# Patient Record
Sex: Female | Born: 1950
Health system: Southern US, Community
[De-identification: ages and names within clinical notes are randomized; demographics above are authoritative.]

## PROBLEM LIST (undated history)

## (undated) DIAGNOSIS — G4700 Insomnia, unspecified: Secondary | ICD-10-CM

## (undated) DIAGNOSIS — F32A Depression, unspecified: Secondary | ICD-10-CM

## (undated) DIAGNOSIS — F329 Major depressive disorder, single episode, unspecified: Secondary | ICD-10-CM

## (undated) DIAGNOSIS — M797 Fibromyalgia: Secondary | ICD-10-CM

## (undated) DIAGNOSIS — M519 Unspecified thoracic, thoracolumbar and lumbosacral intervertebral disc disorder: Secondary | ICD-10-CM

## (undated) DIAGNOSIS — E049 Nontoxic goiter, unspecified: Secondary | ICD-10-CM

## (undated) DIAGNOSIS — K219 Gastro-esophageal reflux disease without esophagitis: Secondary | ICD-10-CM

## (undated) DIAGNOSIS — E7800 Pure hypercholesterolemia, unspecified: Secondary | ICD-10-CM

## (undated) DIAGNOSIS — I10 Essential (primary) hypertension: Secondary | ICD-10-CM

## (undated) DIAGNOSIS — Z9889 Other specified postprocedural states: Secondary | ICD-10-CM

## (undated) DIAGNOSIS — M1990 Unspecified osteoarthritis, unspecified site: Secondary | ICD-10-CM

## (undated) DIAGNOSIS — R112 Nausea with vomiting, unspecified: Secondary | ICD-10-CM

## (undated) HISTORY — DX: Fibromyalgia: M79.7

## (undated) HISTORY — PX: BUNIONECTOMY: SHX129

## (undated) HISTORY — PX: TONSILLECTOMY: SUR1361

## (undated) HISTORY — DX: Unspecified thoracic, thoracolumbar and lumbosacral intervertebral disc disorder: M51.9

## (undated) HISTORY — PX: FACIAL COSMETIC SURGERY: SHX629

## (undated) HISTORY — DX: Pure hypercholesterolemia, unspecified: E78.00

## (undated) HISTORY — DX: Major depressive disorder, single episode, unspecified: F32.9

## (undated) HISTORY — DX: Insomnia, unspecified: G47.00

## (undated) HISTORY — DX: Depression, unspecified: F32.A

---

## 2000-11-09 ENCOUNTER — Ambulatory Visit (HOSPITAL_COMMUNITY): Admission: RE | Admit: 2000-11-09 | Discharge: 2000-11-09 | Payer: Self-pay | Admitting: Pulmonary Disease

## 2002-05-16 ENCOUNTER — Encounter: Payer: Self-pay | Admitting: Unknown Physician Specialty

## 2002-05-16 ENCOUNTER — Ambulatory Visit (HOSPITAL_COMMUNITY): Admission: RE | Admit: 2002-05-16 | Discharge: 2002-05-16 | Payer: Self-pay | Admitting: Unknown Physician Specialty

## 2005-09-18 ENCOUNTER — Ambulatory Visit (HOSPITAL_COMMUNITY): Admission: RE | Admit: 2005-09-18 | Discharge: 2005-09-18 | Payer: Self-pay | Admitting: Pulmonary Disease

## 2005-10-18 ENCOUNTER — Emergency Department (HOSPITAL_COMMUNITY): Admission: EM | Admit: 2005-10-18 | Discharge: 2005-10-18 | Payer: Self-pay | Admitting: Emergency Medicine

## 2005-10-26 ENCOUNTER — Ambulatory Visit (HOSPITAL_COMMUNITY): Admission: RE | Admit: 2005-10-26 | Discharge: 2005-10-26 | Payer: Self-pay | Admitting: Pulmonary Disease

## 2005-11-13 ENCOUNTER — Ambulatory Visit: Payer: Self-pay | Admitting: Internal Medicine

## 2005-11-13 ENCOUNTER — Ambulatory Visit (HOSPITAL_COMMUNITY): Admission: RE | Admit: 2005-11-13 | Discharge: 2005-11-13 | Payer: Self-pay | Admitting: Gastroenterology

## 2007-09-13 ENCOUNTER — Ambulatory Visit (HOSPITAL_COMMUNITY): Admission: RE | Admit: 2007-09-13 | Discharge: 2007-09-13 | Payer: Self-pay | Admitting: Pulmonary Disease

## 2007-09-17 ENCOUNTER — Emergency Department (HOSPITAL_COMMUNITY): Admission: EM | Admit: 2007-09-17 | Discharge: 2007-09-17 | Payer: Self-pay | Admitting: Emergency Medicine

## 2008-03-19 ENCOUNTER — Ambulatory Visit (HOSPITAL_COMMUNITY): Admission: RE | Admit: 2008-03-19 | Discharge: 2008-03-19 | Payer: Self-pay | Admitting: Pulmonary Disease

## 2008-10-29 ENCOUNTER — Ambulatory Visit (HOSPITAL_COMMUNITY): Admission: RE | Admit: 2008-10-29 | Discharge: 2008-10-29 | Payer: Self-pay | Admitting: Endocrinology

## 2009-01-21 ENCOUNTER — Emergency Department (HOSPITAL_COMMUNITY): Admission: EM | Admit: 2009-01-21 | Discharge: 2009-01-22 | Payer: Self-pay | Admitting: Emergency Medicine

## 2009-02-14 ENCOUNTER — Encounter: Payer: Self-pay | Admitting: Orthopedic Surgery

## 2009-05-31 ENCOUNTER — Encounter (INDEPENDENT_AMBULATORY_CARE_PROVIDER_SITE_OTHER): Payer: Self-pay | Admitting: *Deleted

## 2009-05-31 ENCOUNTER — Ambulatory Visit (HOSPITAL_COMMUNITY): Admission: RE | Admit: 2009-05-31 | Discharge: 2009-05-31 | Payer: Self-pay | Admitting: Cardiovascular Disease

## 2009-05-31 ENCOUNTER — Ambulatory Visit: Payer: Self-pay | Admitting: Adult Health

## 2009-05-31 DIAGNOSIS — R5381 Other malaise: Secondary | ICD-10-CM

## 2009-05-31 DIAGNOSIS — R079 Chest pain, unspecified: Secondary | ICD-10-CM | POA: Insufficient documentation

## 2009-05-31 DIAGNOSIS — IMO0001 Reserved for inherently not codable concepts without codable children: Secondary | ICD-10-CM | POA: Insufficient documentation

## 2009-05-31 DIAGNOSIS — R5383 Other fatigue: Secondary | ICD-10-CM

## 2009-05-31 DIAGNOSIS — R0989 Other specified symptoms and signs involving the circulatory and respiratory systems: Secondary | ICD-10-CM | POA: Insufficient documentation

## 2009-05-31 DIAGNOSIS — M26629 Arthralgia of temporomandibular joint, unspecified side: Secondary | ICD-10-CM

## 2009-05-31 DIAGNOSIS — R0602 Shortness of breath: Secondary | ICD-10-CM

## 2009-05-31 DIAGNOSIS — E785 Hyperlipidemia, unspecified: Secondary | ICD-10-CM

## 2009-06-07 ENCOUNTER — Encounter: Payer: Self-pay | Admitting: Adult Health

## 2009-06-10 ENCOUNTER — Telehealth: Payer: Self-pay | Admitting: Adult Health

## 2009-06-11 ENCOUNTER — Encounter: Payer: Self-pay | Admitting: Adult Health

## 2009-06-14 ENCOUNTER — Encounter (INDEPENDENT_AMBULATORY_CARE_PROVIDER_SITE_OTHER): Payer: Self-pay | Admitting: *Deleted

## 2009-06-14 ENCOUNTER — Encounter: Payer: Self-pay | Admitting: Adult Health

## 2009-06-18 ENCOUNTER — Encounter: Payer: Self-pay | Admitting: Orthopedic Surgery

## 2009-06-18 ENCOUNTER — Ambulatory Visit (HOSPITAL_COMMUNITY): Admission: RE | Admit: 2009-06-18 | Discharge: 2009-06-18 | Payer: Self-pay | Admitting: Pulmonary Disease

## 2009-06-19 ENCOUNTER — Encounter: Payer: Self-pay | Admitting: Adult Health

## 2009-06-19 LAB — CONVERTED CEMR LAB
BUN: 9 mg/dL (ref 6–23)
Basophils Absolute: 0.1 10*3/uL (ref 0.0–0.1)
Chloride: 105 meq/L (ref 96–112)
Creatinine, Ser: 0.82 mg/dL (ref 0.40–1.20)
Eosinophils Relative: 2 % (ref 0–5)
INR: 1.1 (ref <1.50)
Lymphocytes Relative: 32 % (ref 12–46)
Neutro Abs: 2.5 10*3/uL (ref 1.7–7.7)
Neutrophils Relative %: 53 % (ref 43–77)
Platelets: 169 10*3/uL (ref 150–400)
Prothrombin Time: 14.1 s (ref 11.6–15.2)
RDW: 12.7 % (ref 11.5–15.5)

## 2009-06-20 ENCOUNTER — Encounter (INDEPENDENT_AMBULATORY_CARE_PROVIDER_SITE_OTHER): Payer: Self-pay

## 2009-06-24 ENCOUNTER — Encounter (INDEPENDENT_AMBULATORY_CARE_PROVIDER_SITE_OTHER): Payer: Self-pay | Admitting: *Deleted

## 2009-06-25 ENCOUNTER — Ambulatory Visit: Payer: Self-pay | Admitting: Cardiology

## 2009-06-25 ENCOUNTER — Inpatient Hospital Stay (HOSPITAL_BASED_OUTPATIENT_CLINIC_OR_DEPARTMENT_OTHER): Admission: RE | Admit: 2009-06-25 | Discharge: 2009-06-25 | Payer: Self-pay | Admitting: Cardiovascular Disease

## 2009-06-27 ENCOUNTER — Ambulatory Visit: Payer: Self-pay | Admitting: Cardiology

## 2009-07-01 ENCOUNTER — Ambulatory Visit: Payer: Self-pay | Admitting: Orthopedic Surgery

## 2009-07-01 DIAGNOSIS — M25519 Pain in unspecified shoulder: Secondary | ICD-10-CM | POA: Insufficient documentation

## 2009-07-01 DIAGNOSIS — M7580 Other shoulder lesions, unspecified shoulder: Secondary | ICD-10-CM

## 2009-07-01 DIAGNOSIS — S43439A Superior glenoid labrum lesion of unspecified shoulder, initial encounter: Secondary | ICD-10-CM

## 2009-07-04 ENCOUNTER — Encounter: Payer: Self-pay | Admitting: Orthopedic Surgery

## 2009-07-09 ENCOUNTER — Telehealth: Payer: Self-pay | Admitting: Orthopedic Surgery

## 2009-07-11 ENCOUNTER — Ambulatory Visit (HOSPITAL_COMMUNITY): Admission: RE | Admit: 2009-07-11 | Discharge: 2009-07-11 | Payer: Self-pay | Admitting: Orthopedic Surgery

## 2009-07-18 ENCOUNTER — Ambulatory Visit: Payer: Self-pay | Admitting: Orthopedic Surgery

## 2009-07-18 DIAGNOSIS — M75120 Complete rotator cuff tear or rupture of unspecified shoulder, not specified as traumatic: Secondary | ICD-10-CM | POA: Insufficient documentation

## 2009-07-19 ENCOUNTER — Ambulatory Visit: Payer: Self-pay | Admitting: Cardiovascular Disease

## 2009-07-22 ENCOUNTER — Encounter: Payer: Self-pay | Admitting: Orthopedic Surgery

## 2009-07-23 ENCOUNTER — Telehealth: Payer: Self-pay | Admitting: Orthopedic Surgery

## 2009-07-26 ENCOUNTER — Encounter: Payer: Self-pay | Admitting: Cardiology

## 2009-07-31 ENCOUNTER — Encounter: Payer: Self-pay | Admitting: Orthopedic Surgery

## 2009-08-12 ENCOUNTER — Encounter (HOSPITAL_COMMUNITY): Admission: RE | Admit: 2009-08-12 | Discharge: 2009-09-11 | Payer: Self-pay | Admitting: Orthopedic Surgery

## 2010-05-03 ENCOUNTER — Encounter: Payer: Self-pay | Admitting: Pulmonary Disease

## 2010-05-04 ENCOUNTER — Encounter: Payer: Self-pay | Admitting: Pulmonary Disease

## 2010-05-11 LAB — CONVERTED CEMR LAB
BUN: 14 mg/dL
CO2: 27 meq/L
Calcium: 9.5 mg/dL
Creatinine, Ser: 0.74 mg/dL
Glucose, Bld: 101 mg/dL
HCT: 42.7 %
Hemoglobin: 13.6 g/dL
INR: 1.03
Prothrombin Time: 13.4 s
WBC: 5.6 10*3/uL

## 2010-05-13 NOTE — Consult Note (Signed)
Summary: Consult note from  Dr. Dion Saucier  Consult note from  Dr. Dion Saucier   Imported By: Jacklynn Ganong 08/06/2009 10:18:53  _____________________________________________________________________  External Attachment:    Type:   Image     Comment:   External Document

## 2010-05-13 NOTE — Letter (Signed)
Summary: Historic Patient File  Historic Patient File   Imported By: Elvera Maria 07/12/2009 09:56:53  _____________________________________________________________________  External Attachment:    Type:   Image     Comment:   history form

## 2010-05-13 NOTE — Assessment & Plan Note (Signed)
Summary: RT SHOULDER PAIN TO BRING XR FILM/REPORT/SEC HOR/HAWKINS/BSF   Vital Signs:  Patient profile:   60 year old female Height:      68 inches Weight:      161 pounds Pulse rate:   62 / minute Resp:     16 per minute  Visit Type:  Initial Consult Referring Provider:  Dr. Juanetta Gosling Primary Provider:  Nehemiah Settle  CC:  right shoulder pain.  History of Present Illness: 60 year old female with atypical RIGHT shoulder pain.  The patient has had pain in her RIGHT shoulder off and on for 3 years.  In the last year it has gotten worse.  She complains of sharp throbbing burning moderate pain (7/10) which is intermittent located over the RIGHT shoulder anterior joint line, increased with movement of the RIGHT shoulder and arm.  She has tried some Advil no relief she cannot find anything that makes it better.  She says she does a lot of lifting she uses the arm quite a bit her pain radiates down into her RIGHT elbow.  He denies any numbness.  She has had a recent workup for chest pain with a heart catheterization this year in March was normal but she also has to wear and is in the two-week period of a Holter monitor test.  Xrays [facility] for review from 02/14/09, MRI from [facility] 06/18/09.  Meds: Wellbutrin, Cymbalta, Ambien, Flexeril, Pravastatin, Ritalin.      Allergies (verified): No Known Drug Allergies  Family History: Family History of Alcoholism:  Family History of Coronary Artery Disease:  Family History of CVA or Stroke:  Family History of Diabetes:  Family History of Hypertension:  Family History of Diabetes Family History Coronary Heart Disease female < 66 Hx, family, kidney disease NEC  Social History: Tobacco Use - No.  married unemployed Alcohol Use - no Regular Exercise - no Drug Use - no 2 cups of coffee per day  Review of Systems Constitutional:  Complains of fatigue; denies weight loss, weight gain, fever, and chills. Cardiovascular:  Complains of  chest pain; denies palpitations, fainting, and murmurs. Respiratory:  Complains of short of breath; denies wheezing, couch, tightness, pain on inspiration, and snoring . Gastrointestinal:  Complains of heartburn; denies nausea, vomiting, diarrhea, constipation, and blood in your stools. Genitourinary:  Denies frequency, urgency, difficulty urinating, painful urination, flank pain, and bleeding in urine. Neurologic:  Complains of numbness, tingling, unsteady gait, and dizziness; denies tremors and seizure. Musculoskeletal:  Complains of joint pain, instability, stiffness, and muscle pain; denies swelling, redness, and heat. Endocrine:  Denies excessive thirst, exessive urination, and heat or cold intolerance. Psychiatric:  Complains of depression; denies nervousness, anxiety, and hallucinations. Skin:  Denies changes in the skin, poor healing, rash, itching, and redness. HEENT:  Denies blurred or double vision, eye pain, redness, and watering; headache, difficult swallowing. Immunology:  Denies seasonal allergies, sinus problems, and allergic to bee stings; lactose intolerance. Hemoatologic:  Complains of brusing; denies easy bleeding.  Physical Exam  Additional Exam:  GEN: well developed, well nourished, normal grooming and hygiene, no deformity and normal body habitus.   CDV: pulses are normal, no edema, no erythema. no tenderness  Lymph: normal lymph nodes   Skin: no rashes, skin lesions or open sores   NEURO: normal coordination, reflexes, sensation.   Psyche: awake, alert and oriented. Mood normal   Gait: Normal  RIGHT shoulder exam inspection she is tender over the anterior joint line has pain when she extends the shoulder joint.  She is nontender over the a.c. joint and posterior joint line she is nontender over that and the subacromial space.  There is no effusion.  There is no crepitation on range of motion  She has full passive range of motion with no contracture.  Her  shoulder is stable she has some pain with abduction external rotation but no joint subluxation  Muscle strength remains grade 5 in internal, external rotation as well as forward elevation.  Her impingement sign is mildly positive.  Her crank test appears to be positive.      Impression & Recommendations:  Problem # 1:  SUPERIOR GLENOID LABRUM TEAR (ICD-840.7) Assessment New  MRI was done March 8.  He was a modestly inadequate study because of motion artifact secondary to patient having pain with her arm in the position needed to do the MRI.  We did see that she has a prominent tear the superior labrum which goes into the biceps anchor, there is thickening of the inferior glenohumeral ligament suggestive of adhesive capsulitis, supraspinatus tendinopathy infraspinatus tendinopathy, lateral sloping but acromion.  After my review of the MRI to see that she probably has a labral tear again motion artifact is limiting the diagnostic accuracy  Recommend repeat study with some pain medication to get a better look at things.  Her plain film done on on February 14, 2009 was normal and the RIGHT shoulder report is included  Orders: New Patient Level III (66440)  Problem # 2:  IMPINGEMENT SYNDROME (ICD-726.2) Assessment: New  Orders: New Patient Level III (34742) Joint Aspirate / Injection, Large (20610) Depo- Medrol 40mg  (J1030)  Problem # 3:  SHOULDER PAIN (ICD-719.41) Assessment: New  Her updated medication list for this problem includes:    Flexeril 10 Mg Tabs (Cyclobenzaprine hcl) .Marland Kitchen... Take as needed    Aspir-trin 325 Mg Tbec (Aspirin) .Marland Kitchen... Take 1 tab daily  Orders: New Patient Level III (59563) Joint Aspirate / Injection, Large (20610) Depo- Medrol 40mg  (J1030)  Patient Instructions: 1)  MRI shoulder come back for results 2)  You have received an injection of cortisone today. You may experience increased pain at the injection site. Apply ice pack to the area for 20 minutes  every 2 hours and take 2 xtra strength tylenol every 8 hours. This increased pain will usually resolve in 24 hours. The injection will take effect in 3-10 days.

## 2010-05-13 NOTE — Miscellaneous (Signed)
Summary: Faxed note to [facility]  I faxed all needed notes to [facility] @ [phone] for pt's cath on 06-25-09. Clinical Lists Changes

## 2010-05-13 NOTE — Letter (Signed)
Summary: *Referral Letter  [facility]  [address] [place]  [place], [place] [address]   Phone: [phone]  Fax: [phone]    07/22/2009  Thank you in advance for agreeing to see my patient:  Teresa [name] [address] [place], [place]  [address]  Phone: [phone]  Reason for Referral: evaluation for arthroscopic labral repair and treatment of partial tear rotator cuff    Current Medical Problems: 1)  RUPTURE ROTATOR CUFF (ICD-727.61) 2)  SUPERIOR GLENOID LABRUM TEAR (ICD-840.7) 3)  IMPINGEMENT SYNDROME (ICD-726.2) 4)  SHOULDER PAIN (ICD-719.41) 5)  HX, FAMILY, KIDNEY DISEASE NEC (ICD-V18.69) 6)  FAMILY HISTORY CORONARY HEART DISEASE FEMALE < 65 (ICD-V17.3) 7)  FAMILY HISTORY OF DIABETES (ICD-V18.0) 8)  TMJ PAIN (ICD-524.62) 9)  HYPERLIPIDEMIA (ICD-272.4) 10)  FIBROMYALGIA (ICD-729.1)   Current Medications: 1)  WELLBUTRIN XL 150 MG XR24H-TAB (BUPROPION HCL) take 1 tab two times a day 2)  BENADRYL 25 MG CAPS (DIPHENHYDRAMINE HCL) take 1 tab daily 3)  CYMBALTA 60 MG CPEP (DULOXETINE HCL) take 1 tab daily 4)  PRAVACHOL 40 MG TABS (PRAVASTATIN SODIUM) take 1 tab daily 5)  RITALIN 10 MG TABS (METHYLPHENIDATE HCL) take 1 tab daily 6)  FLEXERIL 10 MG TABS (CYCLOBENZAPRINE HCL) take as needed 7)  AMBIEN 10 MG TABS (ZOLPIDEM TARTRATE) take 1 tab at bedtime 8)  ESTRADIOL 1 MG TABS (ESTRADIOL) take 1/2 tab daily 9)  PROVERA 5 MG TABS (MEDROXYPROGESTERONE ACETATE) take 1/2 tab daily 10)  CENTRUM SILVER  TABS (MULTIPLE VITAMINS-MINERALS) take 1 tab daily 11)  CALTRATE 600+D PLUS 600-400 MG-UNIT TABS (CALCIUM CARBONATE-VIT D-MIN) take 1 tab two times a day 12)  ASPIR-TRIN 325 MG TBEC (ASPIRIN) take 1 tab daily 13)  KRILL OIL 1000 MG CAPS (KRILL OIL) take 1 tab daily 14)  DEOXY-D-RIBOSE  POWD (DEOXYRIBOSE) take 3 per day 15)  FIBER  POWD (FIBER) use as needed   Past Medical History: 1)  Current Problems:  2)  TMJ PAIN (ICD-524.62) 3)   HYPERLIPIDEMIA (ICD-272.4) 4)  FIBROMYALGIA (ICD-729.1)     Pertinent Labs: MRI   Thank you again for agreeing to see our patient; please contact us if you have any further questions or need additional information.  Sincerely,  Fuller Canada MD

## 2010-05-13 NOTE — Miscellaneous (Signed)
Summary: cbc,pt,ptt,bmp,tsh( pre cath labs)  Clinical Lists Changes  Observations: Added new observation of CALCIUM: 9.5 mg/dL (60/63/0160 10:93) Added new observation of CREATININE: 0.74 mg/dL (23/55/7322 02:54) Added new observation of BUN: 14 mg/dL (27/09/2374 28:31) Added new observation of BG RANDOM: 101 mg/dL (51/76/1607 37:10) Added new observation of CO2 PLSM/SER: 27 meq/L (05/31/2009 13:09) Added new observation of CL SERUM: 103 meq/L (05/31/2009 13:09) Added new observation of K SERUM: 4.5 meq/L (05/31/2009 13:09) Added new observation of NA: 142 meq/L (05/31/2009 13:09) Added new observation of PLATELETK/UL: 202 K/uL (05/31/2009 13:09) Added new observation of MCV: 92.2 fL (05/31/2009 13:09) Added new observation of HCT: 42.7 % (05/31/2009 13:09) Added new observation of HGB: 13.6 g/dL (62/69/4854 62:70) Added new observation of WBC COUNT: 5.6 10*3/microliter (05/31/2009 13:09) Added new observation of TSH: 0.580 microintl units/mL (05/31/2009 13:09) Added new observation of INR: 1.03  (05/31/2009 13:09) Added new observation of PT PATIENT: 13.4 s (05/31/2009 13:09) Added new observation of PTT PATIENT: 27 s (05/31/2009 13:09)

## 2010-05-13 NOTE — Assessment & Plan Note (Signed)
Summary: EPH   Visit Type:  Follow-up Referring Provider:  Dr. Juanetta Gosling Primary Provider:  Nehemiah Settle  CC:  NO CARDIOLOGY COMPLAINTS.  History of Present Illness: Teresa [name] is seen today post cath.  She was seen by Harrold Donath with [facility] and needed pre-op clearance for right shoulder surgery. Her cath was bengin with minimal ostial LAD and OM  She has normal LV function and filling pressures  I reassured Teresa [name] that her heart was ok and she is cleared for surgery.  The tape placed on her groin post-cath casused some redness and irritation but there was no hematoma  Current Problems (verified): 1)  Superior Glenoid Labrum Tear  (ICD-840.7) 2)  Impingement Syndrome  (ICD-726.2) 3)  Shoulder Pain  (ICD-719.41) 4)  Hx, Family, Kidney Disease Nec  (ICD-V18.69) 5)  Family History Coronary Heart Disease Female < 65  (ICD-V17.3) 6)  Family History of Diabetes  (ICD-V18.0) 7)  Unspecified Pre-operative Examination  (ICD-V72.84) 8)  Chest Pain-unspecified  (ICD-786.50) 9)  Tachycardia  (ICD-785) 10)  Tachycardia  (ICD-785) 11)  Dyspnea  (ICD-786.05) 12)  Dyspnea  (ICD-786.05) 13)  Fatigue  (ICD-780.79) 14)  Chest Pain  (ICD-786.50) 15)  Tmj Pain  (ICD-524.62) 16)  Hyperlipidemia  (ICD-272.4) 17)  Fibromyalgia  (ICD-729.1)  Current Medications (verified): 1)  Wellbutrin Xl 150 Mg Xr24h-Tab (Bupropion Hcl) .... Take 1 Tab Two Times A Day 2)  Benadryl 25 Mg Caps (Diphenhydramine Hcl) .... Take 1 Tab Daily 3)  Cymbalta 60 Mg Cpep (Duloxetine Hcl) .... Take 1 Tab Daily 4)  Pravachol 40 Mg Tabs (Pravastatin Sodium) .... Take 1 Tab Daily 5)  Ritalin 10 Mg Tabs (Methylphenidate Hcl) .... Take 1 Tab Daily 6)  Flexeril 10 Mg Tabs (Cyclobenzaprine Hcl) .... Take As Needed 7)  Ambien 10 Mg Tabs (Zolpidem Tartrate) .... Take 1 Tab At Bedtime 8)  Estradiol 1 Mg Tabs (Estradiol) .... Take 1/2 Tab Daily 9)  Provera 5 Mg Tabs (Medroxyprogesterone Acetate) .... Take 1/2 Tab Daily 10)  Centrum Silver   Tabs (Multiple Vitamins-Minerals) .... Take 1 Tab Daily 11)  Caltrate 600+d Plus 600-400 Mg-Unit Tabs (Calcium Carbonate-Vit D-Min) .... Take 1 Tab Two Times A Day 12)  Aspir-Trin 325 Mg Tbec (Aspirin) .... Take 1 Tab Daily 13)  Krill Oil 1000 Mg Caps (Krill Oil) .... Take 1 Tab Daily 14)  Deoxy-D-Ribose  Powd (Deoxyribose) .... Take 3 Per Day 15)  Fiber  Powd (Fiber) .... Use As Needed  Allergies (verified): No Known Drug Allergies  Past History:  Past Medical History: Last updated: 05/31/2009 Current Problems:  TMJ PAIN (ICD-524.62) HYPERLIPIDEMIA (ICD-272.4) FIBROMYALGIA (ICD-729.1)  Past Surgical History: Last updated: 05/31/2009 Tonsilectomy Face lift Bunionectomy  Family History: Last updated: 07/01/2009 Family History of Alcoholism:  Family History of Coronary Artery Disease:  Family History of CVA or Stroke:  Family History of Diabetes:  Family History of Hypertension:  Family History of Diabetes Family History Coronary Heart Disease female < 60 Hx, family, kidney disease NEC  Social History: Last updated: 07/01/2009 Tobacco Use - No.  married unemployed Alcohol Use - no Regular Exercise - no Drug Use - no 2 cups of coffee per day  Review of Systems       Denies fever, malais, weight loss, blurry vision, decreased visual acuity, cough, sputum, SOB, hemoptysis, pleuritic pain, palpitaitons, heartburn, abdominal pain, melena, lower extremity edema, claudication, or rash.   Vital Signs:  Patient profile:   60 year old female Weight:      160 pounds  Pulse rate:   90 / minute BP sitting:   158 / 80  (right arm)  Vitals Entered By: Dreama Saa, CNA (July 19, 2009 11:44 AM)  Physical Exam  General:  Affect appropriate Healthy:  appears stated age HEENT: normal Neck supple with no adenopathy JVP normal no bruits no thyromegaly Lungs clear with no wheezing and good diaphragmatic motion Heart:  S1/S2 no murmur,rub, gallop or click PMI  normal Abdomen: benighn, BS positve, no tenderness, no AAA no bruit.  No HSM or HJR Distal pulses intact with no bruits No edema Neuro non-focal Skin warm and dry Groin well healed with no bruit or hematoma   Impression & Recommendations:  Problem # 1:  CHEST PAIN-UNSPECIFIED (ICD-786.50) Non-cardiac  No significant disease at cath. Clear for ortho surgery Her updated medication list for this problem includes:    Aspir-trin 325 Mg Tbec (Aspirin) .Marland Kitchen... Take 1 tab daily  Patient Instructions: 1)  Your physician recommends that you schedule a follow-up appointment in: as needed  2)  Your physician recommends that you continue on your current medications as directed. Please refer to the Current Medication list given to you today.

## 2010-05-13 NOTE — Medication Information (Signed)
Summary: Copy of prescription  Copy of prescription   Imported By: Jacklynn Ganong 07/03/2009 12:18:16  _____________________________________________________________________  External Attachment:    Type:   Image     Comment:   External Document

## 2010-05-13 NOTE — Progress Notes (Signed)
Summary: MRI appointment.  Phone Note Outgoing Call   Call placed by: Waldon Reining,  July 09, 2009 10:14 AM Call placed to: Patient Action Taken: Phone Call Completed, Appt scheduled Summary of Call: I called to give the patient her MRI appointment at [facility] on 07-11-09 at  4:30. Patient has [employer], authorization [id number]. Patient will follow up back here for her results.

## 2010-05-13 NOTE — Miscellaneous (Signed)
Summary: Orders Update  Clinical Lists Changes  Orders: Added new Referral order of Cardiac Catheterization (Cardiac Cath) - Signed 

## 2010-05-13 NOTE — Letter (Signed)
Summary: Cardiac Catheterization Instructions- [facility]  [facility] [place]  [address], [place] [address]   Phone: [phone]  Fax: [phone]     05/31/2009 MRN: [id number]  Teresa [name] [address] [place], [place]  [address]  Dear Ms. Agyeman,   You are scheduled for a Cardiac Catheterization on 06/07/2009 with Dr.McClean.  Please arrive to the 1st floor of the Heart and Vascular Center at [facility] at 7:30 am  on the day of your procedure. Please do not arrive before 6:30 a.m. Call the Heart and Vascular Center at [phone] if you are unable to make your appointmnet. The Code to get into the parking garage under the building is 0900. Take the elevators to the 1st floor. You must have someone to drive you home. Someone must be with you for the first 24 hours after you arrive home. Please wear clothes that are easy to get on and off and wear slip-on shoes. Do not eat or drink after midnight except water with your medications that morning. Bring all your medications and current insurance cards with you.  ___ DO NOT take these medications before your procedure: ________________________________________________________________  ___ Make sure you take your aspirin.  ___ You may take ALL of your medications with water that morning. ________________________________________________________________________________________________________________________________  ___ DO NOT take ANY medications before your procedure.  ___ Pre-med instructions:  ________________________________________________________________________________________________________________________________  The usual length of stay after your procedure is 2 to 3 hours. This can vary.  If you have any questions, please call the office at the number listed above.   Teressa Lower RN

## 2010-05-13 NOTE — Progress Notes (Signed)
Summary: Question  Phone Note Call from Patient   Caller: Spouse Brett Canales) Reason for Call: Talk to Nurse Summary of Call: pt's husband would like to know if we were gonna schedule anymore "testing" since the cath was "operating outside of guidelines"/tg  Initial call taken by: Raechel Ache [facility],  June 10, 2009 3:20 PM  Follow-up for Phone Call        Bronson Curb, would u like to schedule a stress test  for this pt, since her ins. will not pay for cath, she is to wear a 21 day event monitor also. Follow-up by: Teressa Lower RN,  June 10, 2009 4:57 PM  Additional Follow-up for Phone Call Additional follow up Details #1::        We planned to do a stress myoview intially, but her husband wanted her to have a cath.  We can schedule her for the stress test.     Appended Document: Question pt's spouse call with authorization number from insurance company and wanted her cath scheduled for 06/25/2009, which we did per their request

## 2010-05-13 NOTE — Medication Information (Signed)
Summary: Tax adviser   Imported By: Cammie Sickle 08/03/2009 12:14:41  _____________________________________________________________________  External Attachment:    Type:   Image     Comment:   External Document

## 2010-05-13 NOTE — Procedures (Signed)
Summary: [facility]  [employer]   Imported By: Faythe Ghee 07/26/2009 15:34:16  _____________________________________________________________________  External Attachment:    Type:   Image     Comment:   External Document

## 2010-05-13 NOTE — Miscellaneous (Signed)
Summary: Orders Update  Clinical Lists Changes  Orders: Added new Referral order of Nuclear Stress Test (Nuc Stress Test) - Signed 

## 2010-05-13 NOTE — Letter (Signed)
Summary: AARP MEDICARE PLAN  AARP MEDICARE PLAN   Imported By: Faythe Ghee 06/07/2009 10:42:40  _____________________________________________________________________  External Attachment:    Type:   Image     Comment:   External Document

## 2010-05-13 NOTE — Letter (Signed)
Summary: Cardiac Catheterization Instructions- [facility]  [facility] [place]  [address] [place] [address]   Phone: [phone]  Fax: [phone]     06/14/2009 MRN: [id number]  KENSLIE ABBRUZZESE [address] [place], [place]  [address]  Dear Ms. Aufiero,   You are scheduled for a Cardiac Catheterization on 06/25/2009 with Dr.Stuckey  Please arrive to the 1st floor of the Heart and Vascular Center at [facility] at 9:30 am  on the day of your procedure. Please do not arrive before 6:30 a.m. Call the Heart and Vascular Center at [phone] if you are unable to make your appointmnet. The Code to get into the parking garage under the building is 9000. Take the elevators to the 1st floor. You must have someone to drive you home. Someone must be with you for the first 24 hours after you arrive home. Please wear clothes that are easy to get on and off and wear slip-on shoes. Do not eat or drink after midnight except water with your medications that morning. Bring all your medications and current insurance cards with you.  ___ DO NOT take these medications before your procedure: ________________________________________________________________  ___ Make sure you take your aspirin.  ___ You may take ALL of your medications with water that morning. ________________________________________________________________________________________________________________________________  ___ DO NOT take ANY medications before your procedure.  ___ Pre-med instructions:  ________________________________________________________________________________________________________________________________  The usual length of stay after your procedure is 2 to 3 hours. This can vary.  If you have any questions, please call the office at the number listed above.   Teressa Lower RN

## 2010-05-13 NOTE — Miscellaneous (Signed)
Summary: waiting on case review for MRI case number [id number]  Clinical Lists Changes  an take 2 business days - See phone note.  MRI auth received, Auth # [id number] and appointment scheduled at [facility] 07/11/09 at 4:30pm; patient has follow up appointment here to review results.

## 2010-05-13 NOTE — Progress Notes (Signed)
Summary: Referral to Dr. Dion Saucier.  Phone Note Outgoing Call   Call placed by: Waldon Reining,  July 23, 2009 10:21 AM Call placed to: Patient Action Taken: Information Sent Summary of Call: I faxed a referral for this patient to Dr. Dion Saucier for her right shoulder, labrum tear.

## 2010-05-13 NOTE — Assessment & Plan Note (Signed)
Summary: mri results from [facility]   Visit Type:  Follow-up Referring Provider:  Dr. Juanetta Gosling Primary Provider:  Nehemiah [name]  CC:  right shoulder pain.  History of Present Illness: I saw Teresa [name] in the office today for a followup visit.  She is a 60 years old woman with the complaint of:  right shoulder pain.  Meds: Wellbutrin, Cymbalta, Ambien, Flexeril, Pravastatin, Ritalin.  a second MRI was done and it shows that she does have a superior labral tear.  She also has a partial undersurface tear of the supraspinatus tendon.  There are some capsular changes consistent with adhesive capsulitis  I spent an hour talking to them about the treatment options I think she should have an arthroscopy of the shoulder to repair the labrum and I have advised that she see a shoulder specialist.  They would like to discuss this further over the weekend and let me know on Monday whether they want to go to [facility] or 2 the [facility].    Allergies: No Known Drug Allergies  Past History:  Past medical, surgical, family and social histories (including risk factors) reviewed, and no changes noted (except as noted below).  Past Medical History: Reviewed history from 05/31/2009 and no changes required. Current Problems:  TMJ PAIN (ICD-524.62) HYPERLIPIDEMIA (ICD-272.4) FIBROMYALGIA (ICD-729.1)  Past Surgical History: Reviewed history from 05/31/2009 and no changes required. Tonsilectomy Face lift Bunionectomy  Family History: Reviewed history from 07/01/2009 and no changes required. Family History of Alcoholism:  Family History of Coronary Artery Disease:  Family History of CVA or Stroke:  Family History of Diabetes:  Family History of Hypertension:  Family History of Diabetes Family History Coronary Heart Disease female < 7 Hx, family, kidney disease NEC  Social History: Reviewed history from 07/01/2009 and no changes required. Tobacco Use - No.    married unemployed Alcohol Use - no Regular Exercise - no Drug Use - no 2 cups of coffee per day   Impression & Recommendations:  Problem # 1:  SUPERIOR GLENOID LABRUM TEAR (ICD-840.7) Assessment Comment Only  Orders: Orthopedic Surgeon Referral (Ortho Surgeon) Est. Patient Level III (60454) Orthopedic Surgeon Referral (Ortho Surgeon)  Problem # 2:  RUPTURE ROTATOR CUFF (ICD-727.61) Assessment: Comment Only  Orders: Est. Patient Level III (09811) Orthopedic Surgeon Referral (Ortho Surgeon)  Patient Instructions: 1)  Call us Monday

## 2010-05-13 NOTE — Letter (Signed)
Summary: [place] [employer], agricultural at [facility]  [address], [place] [address]   Phone: [phone]  Fax: [phone]      June 24, 2009 MRN: [id number]   [name] [address] [place], [place]  [address]   Dear Ms. Schorr,  Your test ordered by Teresa [name] has been reviewed by your physician (or physician assistant) and was found to be normal or stable. Your physician (or physician assistant) felt no changes were needed at this time.  ____ Echocardiogram  ____ Cardiac Stress Test  __x__ Lab Work  ____ Peripheral vascular study of arms, legs or neck  ____ CT scan or X-ray  ____ Lung or Breathing test  ____ Other: No change in medical treatment at this time, per Teresa [name].  Thank you, Teresa Terwilliger Allyne Gee RN    Teresa Bing, MD, Teresa Arena.C.Gaylord Shih, MD, F.A.C.C Teresa Bunting, MD, F.A.C.C Teresa Dell, MD, F.A.C.C Teresa Haws, MD, Teresa Arena.C.C

## 2010-05-13 NOTE — Assessment & Plan Note (Signed)
Summary: NP6 CHEST PAIN   Visit Type:  Initial Consult Primary Provider:  Nehemiah Settle  CC:  chest pain.  History of Present Illness: Teresa [name] is a pleasant 60 y/o CF we are seeing on consultation at the request of Dr. Juanetta Gosling for chest pain and increasing DOE.  She has not been seen by a cardiologist in the past.  She has a history of Fibromyalgia, GERD, DJD of the neck, TMJ, thyroid nodules, hypercholesterolemia. She has had progressive fatigue, DOE, and midsternal chest pain. She states that she has noticed increased HR and clamminess associated.  Not all of these symptoms occur together, and chest discomfort is hard to distinguish from fibromyalgia pain.  However, her husband who accompanies her states she diminishes her discomfort, and he has noticed her DOE more.  She states that the pain is a midsternal ache, non radiating, that can occur with and without exertion.  She recently moved her bedroom furniture around and had racing HR, pre-syncope, and SOB soon after.  These symptoms were expressed to Dr. Juanetta Gosling who requested this evaluation.  Preventive Screening-Counseling & Management  Alcohol-Tobacco     Alcohol drinks/day: 0     Smoking Status: never  Problems Prior to Update: 1)  Tmj Pain  (ICD-524.62) 2)  Hyperlipidemia  (ICD-272.4) 3)  Fibromyalgia  (ICD-729.1)  Medications Prior to Update: 1)  Prednisone 20 Mg Tabs (Prednisone) .... Take As Directed 2)  Wellbutrin Xl 150 Mg Xr24h-Tab (Bupropion Hcl) .... Take 1 Tab Daily 3)  Benadryl 25 Mg Caps (Diphenhydramine Hcl) .... Take 1 Tab Daily 4)  Cymbalta 60 Mg Cpep (Duloxetine Hcl) .... Take 1 Tab Daily 5)  Pravachol 40 Mg Tabs (Pravastatin Sodium) .... Take 1 Tab Daily 6)  Ritalin 10 Mg Tabs (Methylphenidate Hcl) .... Take 1 Tab Daily 7)  Flexeril 10 Mg Tabs (Cyclobenzaprine Hcl) .... Take As Needed 8)  Ambien 10 Mg Tabs (Zolpidem Tartrate) .... Take 1 Tab At Bedtime  Current Medications (verified): 1)  Wellbutrin  Xl 150 Mg Xr24h-Tab (Bupropion Hcl) .... Take 1 Tab Two Times A Day 2)  Benadryl 25 Mg Caps (Diphenhydramine Hcl) .... Take 1 Tab Daily 3)  Cymbalta 60 Mg Cpep (Duloxetine Hcl) .... Take 1 Tab Daily 4)  Pravachol 40 Mg Tabs (Pravastatin Sodium) .... Take 1 Tab Daily 5)  Ritalin 10 Mg Tabs (Methylphenidate Hcl) .... Take 1 Tab Daily 6)  Flexeril 10 Mg Tabs (Cyclobenzaprine Hcl) .... Take As Needed 7)  Ambien 10 Mg Tabs (Zolpidem Tartrate) .... Take 1 Tab At Bedtime 8)  Estradiol 1 Mg Tabs (Estradiol) .... Take 1/2 Tab Daily 9)  Provera 5 Mg Tabs (Medroxyprogesterone Acetate) .... Take 1/2 Tab Daily 10)  Centrum Silver  Tabs (Multiple Vitamins-Minerals) .... Take 1 Tab Daily 11)  Caltrate 600+d Plus 600-400 Mg-Unit Tabs (Calcium Carbonate-Vit D-Min) .... Take 1 Tab Two Times A Day 12)  Aspir-Trin 325 Mg Tbec (Aspirin) .... Take 1 Tab Daily 13)  Krill Oil 1000 Mg Caps (Krill Oil) .... Take 1 Tab Daily 14)  Deoxy-D-Ribose  Powd (Deoxyribose) .... Take 3 Per Day 15)  Fiber  Powd (Fiber) .... Use As Needed  Allergies (verified): No Known Drug Allergies  Past History:  Past Surgical History: Tonsilectomy Face lift Bunionectomy  Family History: Family History of Alcoholism:  Family History of Coronary Artery Disease:  Family History of CVA or Stroke:  Family History of Diabetes:  Family History of Hypertension:   Social History: Alcohol drinks/day:  0  Review of Systems  Fatigue All other systems have been reviewed and are negative unless stated above.   Vital Signs:  Patient profile:   60 year old female Height:      67 inches Weight:      166 pounds BMI:     26.09 Pulse rate:   103 / minute BP sitting:   139 / 73  (right arm)  Vitals Entered By: Dreama Saa, CNA (May 31, 2009 10:34 AM)  Physical Exam  General:  Well developed, well nourished, in no acute distress. Head:  normocephalic and atraumatic Eyes:  PERRLA/EOM intact; conjunctiva and lids  normal. Ears:  TM's intact and clear with normal canals and hearing Nose:  no deformity, discharge, inflammation, or lesions Mouth:  Teeth, gums and palate normal. Oral mucosa normal. Neck:  enlarged thyroid.  enlarged thyroid.  enlarged thyroid.   Lungs:  Clear bilaterally to auscultation and percussion. Heart:  Non-displaced PMI, chest non-tender; regular rate and rhythm, S1, S2 without murmurs, rubs or gallops. Carotid upstroke normal, no bruit. Normal abdominal aortic size, no bruits. Femorals normal pulses, no bruits. Pedals normal pulses. No edema, no varicosities. Abdomen:  Bowel sounds positive; abdomen soft and non-tender without masses, organomegaly, or hernias noted. No hepatosplenomegaly. Msk:  C/o of R shoulder pain. decreased ROM.  decreased ROM.  decreased ROM.   Pulses:  pulses normal in all 4 extremities Extremities:  No clubbing or cyanosis. Neurologic:  weakness noted: R shoulderweakness noted:.   Skin:  Intact without lesions or rashes. Psych:  Normal affect.   EKG  Procedure date:  05/31/2009  Findings:      Normal sinus rhythm with rate of:  95bpm  Impression & Recommendations:  Problem # 1:  TACHYCARDIA (ICD-785) Place cardionet for assessment of SVT or tachyarrythmias.    Problem # 2:  CHEST PAIN (ICD-786.50) She has typical and atypical features concerning chest discomfort. She is uncertain if this is related to fibromyalgia or different.  Her husband insists on cardiac catherization to have 100% certainty concenring CAD with her family history.  Dr. Eden Emms has also spoken with the patient and her husband and we will proceed with cardiac catherization. This will be scheduled next week.  Risks and benefits have been discussed. They verbalize understanding and are willing to proceed. Her updated medication list for this problem includes:    Aspir-trin 325 Mg Tbec (Aspirin) .Marland Kitchen... Take 1 tab daily  Orders: Cardiac Catheterization (Cardiac Cath) Cardionet/Event  Monitor (Cardionet/Event) T-Chest x-ray, 2 views ([id number])Future Orders: T-Basic Metabolic Panel [id number]) ... 06/05/2009 T-CBC w/Diff ([id number]) ... 06/05/2009 T-Protime, Auto [id number]) ... 06/05/2009 T-PTT ([id number]) ... 06/05/2009 T-TSH [id number]) ... 06/05/2009  Patient Instructions: 1)  Your physician recommends that you schedule a follow-up appointment in: after cath and event monitor 2)  Your physician has recommended that you wear an event monitor.  Event monitors are medical devices that record the heart's electrical activity. Doctors most often use these monitors to diagnose arrhythmias. Arrhythmias are problems with the speed or rhythm of the heartbeat. The monitor is a small, portable device. You can wear one while you do your normal daily activities. This is usually used to diagnose what is causing palpitations/syncope (passing out). 3)  Your physician has requested that you have a cardiac catheterization.  Cardiac catheterization is used to diagnose and/or treat various heart conditions. Doctors may recommend this procedure for a number of different reasons. The most common reason is to evaluate chest pain. Chest pain can be a symptom of coronary artery disease (  CAD), and cardiac catheterization can show whether plaque is narrowing or blocking your heart's arteries. This procedure is also used to evaluate the valves, as well as measure the blood flow and oxygen levels in different parts of your heart.  For further information please visit [email or web address]  Please follow instruction sheet, as given.

## 2010-05-13 NOTE — Miscellaneous (Signed)
Summary: pre cath labs  Clinical Lists Changes  Problems: Added new problem of UNSPECIFIED PRE-OPERATIVE EXAMINATION (ICD-V72.84) - cardiac catherization Orders: Added new Test order of T-Basic Metabolic Panel [id number]) - Signed Added new Test order of T-CBC w/Diff [id number]) - Signed Added new Test order of T-PTT ([id number]) - Signed Added new Test order of T-Protime, Auto ([id number]) - Signed

## 2010-06-17 ENCOUNTER — Ambulatory Visit (INDEPENDENT_AMBULATORY_CARE_PROVIDER_SITE_OTHER): Payer: Self-pay | Admitting: Internal Medicine

## 2010-08-29 NOTE — Op Note (Signed)
NAMEJOHNATHON, [name]                ACCOUNT NO.:  [id number]   MEDICAL RECORD NO.:  [id number]          PATIENT TYPE:  AMB   LOCATION:  DAY                           FACILITY:  [facility]   PHYSICIAN:  Lionel December, M.D.    DATE OF BIRTH:  October 11, 1950   DATE OF PROCEDURE:  11/13/2005  DATE OF DISCHARGE:                                 OPERATIVE REPORT   PROCEDURE:  Colonoscopy.   INDICATIONS:  Teresa [name] is a 60 year old Caucasian female, who is here for  average-risk screening colonoscopy.  Family history is negative for  colorectal carcinoma and positive for polyps in her mother and two sisters.  Procedure and risks were reviewed with the patient and informed consent was  obtained.   MEDICATIONS FOR CONSCIOUS SEDATION:  Demerol 50 mg IV, Versed 10 mg IV.   FINDINGS:  Procedure performed in endoscopy suite.  The patient's vital  signs and O2 sat were monitored during the procedure and remained stable.  The patient was placed in left lateral recumbent position and rectal  examination performed.  No abnormality noted on external or digital exam.  Olympus video scope was placed in the rectum and advanced under vision into  the sigmoid colon beyond.  Preparation was excellent.  Scope was passed into  the cecum, which was identified by the ileocecal valve and appendiceal  orifice.  Pictures were taken for the record.  Colonic mucosa was examined  for the second time on the way out and was normal throughout.  Rectal mucosa  similarly was normal.  Scope was retroflexed to examine the anorectal  junction and small hemorrhoids were noted below the dentate line.  Endoscope  was straightened and withdrawn.  The patient tolerated the procedure well.   FINAL DIAGNOSES:  Small external hemorrhoids, otherwise normal colonoscopy.   RECOMMENDATIONS:  1.  She will resume her usual medications.  2.  High-fiber diet, which would help with her constipation.  3.  Yearly Hemoccults.  4.  She may consider next  exam in 10 years from now, unless her family      history has changed before then.      Lionel December, M.D.  Electronically Signed     NR/MEDQ  D:  11/13/2005  T:  11/13/2005  Job:  [id number]   cc:   Ramon Dredge L. Juanetta Gosling, M.D.  Fax: [phone]

## 2010-09-11 ENCOUNTER — Ambulatory Visit (INDEPENDENT_AMBULATORY_CARE_PROVIDER_SITE_OTHER): Payer: Self-pay

## 2010-09-11 DIAGNOSIS — I83893 Varicose veins of bilateral lower extremities with other complications: Secondary | ICD-10-CM

## 2010-10-23 ENCOUNTER — Ambulatory Visit: Payer: Self-pay

## 2011-01-08 LAB — CBC
Hemoglobin: 14
Platelets: 212
RDW: 12.8

## 2011-01-08 LAB — B-NATRIURETIC PEPTIDE (CONVERTED LAB): Pro B Natriuretic peptide (BNP): <30

## 2011-01-08 LAB — BASIC METABOLIC PANEL
Calcium: 9.5
GFR calc non Af Amer: >60
Glucose, Bld: 101 — ABNORMAL HIGH
Sodium: 139

## 2011-01-08 LAB — POCT CARDIAC MARKERS
Myoglobin, poc: 155
Operator id: 217431

## 2011-01-08 LAB — DIFFERENTIAL
Basophils Absolute: 0
Lymphocytes Relative: 25
Neutro Abs: 5.6

## 2011-01-08 LAB — D-DIMER, QUANTITATIVE: D-Dimer, Quant: 1.1 — ABNORMAL HIGH

## 2011-02-06 ENCOUNTER — Encounter (INDEPENDENT_AMBULATORY_CARE_PROVIDER_SITE_OTHER): Payer: Self-pay | Admitting: *Deleted

## 2011-02-17 ENCOUNTER — Other Ambulatory Visit (INDEPENDENT_AMBULATORY_CARE_PROVIDER_SITE_OTHER): Payer: Self-pay | Admitting: *Deleted

## 2011-02-17 ENCOUNTER — Ambulatory Visit (INDEPENDENT_AMBULATORY_CARE_PROVIDER_SITE_OTHER): Payer: Medicare Other | Admitting: Internal Medicine

## 2011-02-17 ENCOUNTER — Encounter (INDEPENDENT_AMBULATORY_CARE_PROVIDER_SITE_OTHER): Payer: Self-pay | Admitting: *Deleted

## 2011-02-17 ENCOUNTER — Encounter (INDEPENDENT_AMBULATORY_CARE_PROVIDER_SITE_OTHER): Payer: Self-pay | Admitting: Internal Medicine

## 2011-02-17 VITALS — BP 138/70 | HR 80 | Temp 98.6°F | Ht 67.0 in | Wt 158.2 lb

## 2011-02-17 DIAGNOSIS — K219 Gastro-esophageal reflux disease without esophagitis: Secondary | ICD-10-CM

## 2011-02-17 DIAGNOSIS — K5900 Constipation, unspecified: Secondary | ICD-10-CM

## 2011-02-17 DIAGNOSIS — K625 Hemorrhage of anus and rectum: Secondary | ICD-10-CM

## 2011-02-17 NOTE — Progress Notes (Signed)
Subjective:     Patient ID: Teresa [name], female   DOB: [birth year], 60 y.o.   MRN: [id number]  [facility] is present in room during the exam. [name] is a 60 yr old female referred to our office by Dr. Juanetta Gosling for acid reflux. She says she has reflux which she describes as bad. She says her throat is sore, and she coughs a lot. She also c/o harshness.  [name] also says foods ar lodging in her esophagus. Buscuits will lodge in her esophagus.     . She also c/o bloating which she  Has had for along time.  She also has epigastric pain off and on for 10 yrs. Appetite is good. No weight loss.  She tells me that she has a BM about every 2-3 days. She occasionally see rectal bleeding.  Stools are dark blue which she attributes to eating blueberries daily..    . She sometimes sleeps in a recliner.  Colonoscopy 11/13/2005 Dr. Delorise Shiner DIAGNOSES:  Small external hemorrhoids, otherwise normal colonoscopy.  RECOMMENDATIONS:  1. She will resume her usual medications.  2. High-fiber diet, which would help with her constipation.  3. Yearly Hemoccults.  4. She may consider next exam in 10 years from now, unless her family  history has changed before then.   Review of Systems see hpi Current Outpatient Prescriptions  Medication Sig Dispense Refill  . aspirin 81 MG tablet Take 81 mg by mouth daily.        . beta carotene w/minerals (OCUVITE) tablet Take 1 tablet by mouth daily.        Marland Kitchen buPROPion (WELLBUTRIN SR) 150 MG 12 hr tablet Take 150 mg by mouth 2 (two) times daily.        Marland Kitchen co-enzyme Q-10 30 MG capsule Take 30 mg by mouth 3 (three) times daily.        . DULoxetine (CYMBALTA) 60 MG capsule Take 60 mg by mouth daily.        Marland Kitchen KRILL OIL 1000 MG CAPS Take by mouth.        . medroxyPROGESTERone (PROVERA) 2.5 MG tablet Take 2.5 mg by mouth daily.        . methylphenidate (RITALIN LA) 20 MG 24 hr capsule Take 20 mg by mouth every morning.        Marland Kitchen omeprazole (PRILOSEC) 20 MG capsule Take 20 mg by mouth  daily.        . pravastatin (PRAVACHOL) 40 MG tablet Take 40 mg by mouth daily.        Marland Kitchen zolpidem (AMBIEN CR) 12.5 MG CR tablet Take 12.5 mg by mouth at bedtime as needed.         Past Surgical History  Procedure Date  . Facial cosmetic surgery   . Tonsillectomy   . Bunionectomy    Past Medical History  Diagnosis Date  . High cholesterol   . Depression   . Insomnia   . Disc disorder   . Fibromyalgia    History   Social History Narrative  . No narrative on file   Family Status  Relation Status Death Age  . Mother Deceased     kidney failure, CAD  . Father Deceased     Accident   . Sister Alive     Fibromyalgia, diabetes, HTN  . Brother Deceased     CAD   History   Social History Narrative  . No narrative on file   History   Social History  . Marital  Status: Married    Spouse Name: N/A    Number of Children: N/A  . Years of Education: N/A   Occupational History  . Not on file.   Social History Main Topics  . Smoking status: Never Smoker   . Smokeless tobacco: Not on file  . Alcohol Use: No  . Drug Use: No  . Sexually Active: Not on file   Other Topics Concern  . Not on file   Social History Narrative  . No narrative on file   No Known Allergies     Objective:   Physical Exam Filed Vitals:   02/17/11 1105  Height: 5\' 7"  (1.702 m)  Weight: 158 lb 3.2 oz (71.759 kg)    Alert and oriented. Skin warm and dry. Oral mucosa is moist. Natural teeth in good condition. Sclera anicteric, conjunctivae is pink. Thyroid not enlarged. No cervical lymphadenopathy. Lungs clear. Heart regular rate and rhythm.  Abdomen is soft. Bowel sounds are positive. No hepatomegaly. No abdominal masses felt. No tenderness.  No edema to lower extremities. Patient is alert and oriented.      Assessment:    GERD which is not controlled at this time, Dysphagia. Rectal bleeding when she is constipated.    Plan:     EGD//ED with Dr. Karilyn Cota. Stool softner BID. Dexilant  samples given to take 30 minutes before breakfast. (6 boxes) GERD diet given to patient. Advised to elevate HOB

## 2011-02-17 NOTE — Patient Instructions (Signed)
GERD diet given to patient. Dexilant samples (6 boxes).  Take 30 minutes before breakfast.

## 2011-03-12 ENCOUNTER — Encounter (HOSPITAL_COMMUNITY): Payer: Self-pay | Admitting: Pharmacy Technician

## 2011-03-13 ENCOUNTER — Encounter: Payer: Self-pay | Admitting: Cardiovascular Disease

## 2011-03-18 MED ORDER — SODIUM CHLORIDE 0.45 % IV SOLN
Freq: Once | INTRAVENOUS | Status: AC
  Administered 2011-03-19: 11:00:00 via INTRAVENOUS

## 2011-03-19 ENCOUNTER — Encounter (HOSPITAL_COMMUNITY)
Admission: RE | Disposition: A | Discharge status: Home / Self Care | Payer: Self-pay | Source: Ambulatory Visit | Attending: Internal Medicine

## 2011-03-19 ENCOUNTER — Other Ambulatory Visit (INDEPENDENT_AMBULATORY_CARE_PROVIDER_SITE_OTHER): Payer: Self-pay | Admitting: Internal Medicine

## 2011-03-19 ENCOUNTER — Encounter (HOSPITAL_COMMUNITY): Payer: Self-pay | Admitting: *Deleted

## 2011-03-19 ENCOUNTER — Ambulatory Visit (HOSPITAL_COMMUNITY)
Admission: RE | Admit: 2011-03-19 | Discharge: 2011-03-19 | Disposition: A | Discharge status: Home / Self Care | Payer: Medicare Other | Source: Ambulatory Visit | Attending: Internal Medicine | Admitting: Internal Medicine

## 2011-03-19 DIAGNOSIS — K319 Disease of stomach and duodenum, unspecified: Secondary | ICD-10-CM

## 2011-03-19 DIAGNOSIS — K2940 Chronic atrophic gastritis without bleeding: Secondary | ICD-10-CM | POA: Insufficient documentation

## 2011-03-19 DIAGNOSIS — R1013 Epigastric pain: Secondary | ICD-10-CM | POA: Insufficient documentation

## 2011-03-19 DIAGNOSIS — K219 Gastro-esophageal reflux disease without esophagitis: Secondary | ICD-10-CM

## 2011-03-19 DIAGNOSIS — Z7982 Long term (current) use of aspirin: Secondary | ICD-10-CM | POA: Insufficient documentation

## 2011-03-19 DIAGNOSIS — R1310 Dysphagia, unspecified: Secondary | ICD-10-CM | POA: Insufficient documentation

## 2011-03-19 DIAGNOSIS — K2960 Other gastritis without bleeding: Secondary | ICD-10-CM

## 2011-03-19 DIAGNOSIS — K208 Other esophagitis: Secondary | ICD-10-CM

## 2011-03-19 DIAGNOSIS — E7800 Pure hypercholesterolemia, unspecified: Secondary | ICD-10-CM | POA: Insufficient documentation

## 2011-03-19 HISTORY — DX: Unspecified osteoarthritis, unspecified site: M19.90

## 2011-03-19 HISTORY — DX: Gastro-esophageal reflux disease without esophagitis: K21.9

## 2011-03-19 SURGERY — ESOPHAGOGASTRODUODENOSCOPY (EGD) WITH ESOPHAGEAL DILATION
Anesthesia: Moderate Sedation

## 2011-03-19 MED ORDER — MEPERIDINE HCL 50 MG/ML IJ SOLN
INTRAMUSCULAR | Status: AC
  Filled 2011-03-19: qty 1

## 2011-03-19 MED ORDER — MEPERIDINE HCL 25 MG/ML IJ SOLN
INTRAMUSCULAR | Status: DC | PRN
  Administered 2011-03-19 (×2): 25 mg via INTRAVENOUS

## 2011-03-19 MED ORDER — MIDAZOLAM HCL 5 MG/5ML IJ SOLN
INTRAMUSCULAR | Status: AC
  Filled 2011-03-19: qty 10

## 2011-03-19 MED ORDER — OMEPRAZOLE-SODIUM BICARBONATE 40-1100 MG PO CAPS: 1.0000 | ORAL_CAPSULE | Freq: Every day | ORAL | Status: DC

## 2011-03-19 MED ORDER — STERILE WATER FOR IRRIGATION IR SOLN
Status: DC | PRN
  Administered 2011-03-19: 11:00:00

## 2011-03-19 MED ORDER — BUTAMBEN-TETRACAINE-BENZOCAINE 2-2-14 % EX AERO
INHALATION_SPRAY | CUTANEOUS | Status: DC | PRN
  Administered 2011-03-19: 2 via TOPICAL

## 2011-03-19 MED ORDER — MIDAZOLAM HCL 5 MG/5ML IJ SOLN
INTRAMUSCULAR | Status: DC | PRN
  Administered 2011-03-19 (×2): 2 mg via INTRAVENOUS
  Administered 2011-03-19: 1 mg via INTRAVENOUS
  Administered 2011-03-19 (×2): 2 mg via INTRAVENOUS
  Administered 2011-03-19: 1 mg via INTRAVENOUS
  Administered 2011-03-19: 2 mg via INTRAVENOUS

## 2011-03-19 MED ORDER — MIDAZOLAM HCL 5 MG/5ML IJ SOLN
INTRAMUSCULAR | Status: AC
  Filled 2011-03-19: qty 5

## 2011-03-19 NOTE — Op Note (Signed)
EGD PROCEDURE REPORT  PATIENT:  Teresa [name]  MR#:  [id number] Birthdate:  27-Jun-1950, 60 y.o., female Endoscopist:  Dr. Malissa Hippo, MD Referred By:  Dr. Oneal Deputy. Juanetta Gosling, MD Procedure Date: 03/19/2011  Procedure:   EGD with ED.  Indications:  Patient is 60 years old Caucasian female with a symptoms of GERD for more than 10 years and treatment is not effective. He has daily heartburn for this or congestion or chest sore throat hoarseness and intermittent cough. Chest x-ray was negative. He also complains of intermittent upper abdominal pain and bloating.           Informed Consent:  The risks, benefits, alternatives & imponderables which include, but are not limited to, bleeding, infection, perforation, drug reaction and potential missed lesion have been reviewed.  The potential for biopsy, lesion removal, esophageal dilation, etc. have also been discussed.  Questions have been answered.  All parties agreeable.  Please see history & physical in medical record for more information.  Medications:  Demerol 50 mg IV Versed 12 mg IV Cetacaine spray topically for oropharyngeal anesthesia  Description of procedure:  The endoscope was introduced through the mouth and advanced to the second portion of the duodenum without difficulty or limitations. The mucosal surfaces were surveyed very carefully during advancement of the scope and upon withdrawal.  Findings:  Esophagus:  Mucosa of the esophagus was normal. Focal edema and erythema noted at GE junction. No ring or stricture identified. GEJ:  36 cm Hiatus:  38 cm Stomach:  Stomach was empty and distended very well with insufflation. Folds in the proximal stomach were normal. Examination of mucosa revealed few antral erosions along with linear erythema. No ulcer crater was noted. Pyloric channel was patent. Angularis fundus and cardia were examined by retroflexing the scope and were normal. Duodenum:  Bulbar mucosa was normal. There was edema  to post bulbar mucosa and somewhat blunted folds. Biopsy was taken a routine histology.  Therapeutic/Diagnostic Maneuvers Performed:  Esophagus dilated by passing 54 French Maloney dilator to full insertion but no mucosal disruption induced. Biopsy was also taken from second part of duodenum  Complications:  None  Impression: Mild changes of reflux esophagitis limited to GE junction. No evidence of esophageal web, ring or stricture. Esophagus dilated by passing 54 French Maloney dilator given history of solid food dysphagia. Erosive antral gastritis. Abnormal appearance to mucosa of post bulbar duodenum. Therefore biopsy taken to rule out celiac disease.  Recommendations:  Continue anti-reflux measures. Discontinue Dexilant. Start Zegrid 40  mg by mouth twice a day. H. pylori serology. Upper abdominal ultrasound. Office visit in 4-6 weeks. Crue Otero U  03/19/2011  11:55 AM  CC: Dr. Fredirick Maudlin, MD & Dr. Bonnetta Barry ref. provider found

## 2011-03-19 NOTE — H&P (Signed)
Teresa [name] is an 60 y.o. female.   Chief Complaint: Patient is here for esophagogastroduodenoscopy. HPI: Patient is 60 year old Caucasian female who's had symptoms of GERD for at least 10 years. She did great with omeprazole for several years. For the last few months she's been having daily heartburn, hoarseness, cough as well as feeling of fullness and congestion or chest. He was evaluated by Dr. Juanetta Gosling. Chest x-ray was negative. He felt most of her symptoms are due to GERD and therefore referred her to our office. Patient was switched to Dexilant one month ago and cannot tell any difference. She also has been experiencing dysphagia primarily to solids. She complains of bloating and has had pain across her upper abdomen radiating posteriorly on few occasions. Her bowels move regularly as long as she takes her Metamucil and watches her diet. She denies melena or rectal bleeding. Patient is up-to-date on her screening for CRC last exam in 2007.  Past Medical History  Diagnosis Date  . High cholesterol   . Depression   . Insomnia   . Disc disorder   . Fibromyalgia   . GERD (gastroesophageal reflux disease)   . Arthritis     Past Surgical History  Procedure Date  . Facial cosmetic surgery   . Tonsillectomy   . Bunionectomy     Family History  Problem Relation Age of Onset  . Colon cancer Neg Hx    Social History:  reports that she has never smoked. She does not have any smokeless tobacco history on file. She reports that she does not drink alcohol or use illicit drugs.  Allergies:  Allergies  Allergen Reactions  . Lactose Intolerance (Gi)   . Strawberry Other (See Comments)    Possible allergic reaction, patient is unsure  . Latex Rash    Medications Prior to Admission  Medication Dose Route Frequency Provider Last Rate Last Dose  . 0.45 % sodium chloride infusion   Intravenous Once Malissa Hippo, MD 20 mL/hr at 03/19/11 1059    . meperidine (DEMEROL) 50 MG/ML injection            . midazolam (VERSED) 5 MG/5ML injection            No current outpatient prescriptions on file as of 03/19/2011.    No results found for this or any previous visit (from the past 48 hour(s)). No results found.  Review of Systems  Constitutional: Positive for weight loss (15 pound weight gain inover 5 years).  Gastrointestinal: Negative for nausea, vomiting, diarrhea, blood in stool and melena.    Blood pressure 135/74, pulse 102, temperature 98.4 F (36.9 C), temperature source Oral, resp. rate 22, height 5\' 7"  (1.702 m), weight 157 lb (71.215 kg), SpO2 99.00%. Physical Exam  Constitutional: She appears well-developed and well-nourished.  HENT:  Mouth/Throat: Oropharynx is clear and moist.  Eyes: Conjunctivae are normal. No scleral icterus.  Neck: No thyromegaly present.  Cardiovascular: Normal rate, regular rhythm and normal heart sounds.   No murmur heard. Respiratory: Effort normal and breath sounds normal.  GI: Soft. She exhibits no mass. There is no tenderness.  Musculoskeletal: She exhibits no edema.  Lymphadenopathy:    She has no cervical adenopathy.  Neurological: She is alert.  Skin: Skin is warm and dry.     Assessment/Plan Chronic GERD now refractory to therapy. Solid food dysphagia. Physical gastroduodenoscopy and possible ED.  REHMAN,NAJEEB U 03/19/2011, 11:16 AM

## 2011-03-25 ENCOUNTER — Ambulatory Visit (HOSPITAL_COMMUNITY)
Admit: 2011-03-25 | Discharge: 2011-03-25 | Disposition: A | Discharge status: Home / Self Care | Payer: Medicare Other | Source: Ambulatory Visit | Attending: Internal Medicine | Admitting: Internal Medicine

## 2011-03-25 DIAGNOSIS — R109 Unspecified abdominal pain: Secondary | ICD-10-CM | POA: Insufficient documentation

## 2011-03-25 DIAGNOSIS — K7689 Other specified diseases of liver: Secondary | ICD-10-CM | POA: Insufficient documentation

## 2011-04-03 ENCOUNTER — Encounter (INDEPENDENT_AMBULATORY_CARE_PROVIDER_SITE_OTHER): Payer: Self-pay | Admitting: *Deleted

## 2011-04-20 ENCOUNTER — Ambulatory Visit (INDEPENDENT_AMBULATORY_CARE_PROVIDER_SITE_OTHER): Payer: Medicare Other | Admitting: Internal Medicine

## 2011-04-28 ENCOUNTER — Ambulatory Visit (INDEPENDENT_AMBULATORY_CARE_PROVIDER_SITE_OTHER): Payer: Medicare Other | Admitting: Internal Medicine

## 2011-05-07 ENCOUNTER — Ambulatory Visit (INDEPENDENT_AMBULATORY_CARE_PROVIDER_SITE_OTHER): Payer: Medicare Other | Admitting: Internal Medicine

## 2011-05-21 ENCOUNTER — Encounter (INDEPENDENT_AMBULATORY_CARE_PROVIDER_SITE_OTHER): Payer: Self-pay | Admitting: Internal Medicine

## 2011-05-21 ENCOUNTER — Ambulatory Visit (INDEPENDENT_AMBULATORY_CARE_PROVIDER_SITE_OTHER): Payer: Medicare Other | Admitting: Internal Medicine

## 2011-05-21 DIAGNOSIS — K219 Gastro-esophageal reflux disease without esophagitis: Secondary | ICD-10-CM

## 2011-05-21 DIAGNOSIS — R1013 Epigastric pain: Secondary | ICD-10-CM

## 2011-05-21 NOTE — Patient Instructions (Signed)
Continue present medications. Amylase and lipase today.

## 2011-05-21 NOTE — Progress Notes (Signed)
Subjective:     Patient ID: Teresa [name], female   DOB: 02/04/1951, 61 y.o.   MRN: [id number]  HPI An is a 61 yr old female here today for f/u after undergoing an EGD in December for uncontrolled GERD with a PPI and dysphagia. She tells me today.  She tells me she still is having problems swallowing.   She l has problems swallowing breads and crackers. She comments that she has a dry mouth.  She does tell me that her acid reflux is some better with the Zegerid.    Appetite is good. No weight loss. She occasionally has epigastric pain radiating into her back. This occurred about 2 months ago.  She usually has a BM about one every other day. No melena or bright rectal bleeding. She does have a hx of constipation,. Biopsy of EGD negative for Celiac H. Pylori negative   EGD/ED  03/19/11 Impression:  Mild changes of reflux esophagitis limited to GE junction.  No evidence of esophageal web, ring or stricture. Esophagus dilated by passing 54 French Maloney dilator given history of solid food dysphagia.  Erosive antral gastritis.  Abnormal appearance to mucosa of post bulbar duodenum. Therefore biopsy taken to rule out celiac disease.   03/19/2011 Abdominal US: IMPRESSION:  Benign cyst in the liver  Pancreas obscured.   Colonoscopy 11/2005: FINAL DIAGNOSES: Small external hemorrhoids, otherwise normal colonoscopy.  RECOMMENDATIONS:   Review of Systems see hpi     Current Outpatient Prescriptions  Medication Sig Dispense Refill  . aspirin 325 MG buffered tablet Take 325 mg by mouth every morning.        Marland Kitchen buPROPion (WELLBUTRIN SR) 150 MG 12 hr tablet Take 150 mg by mouth 2 (two) times daily.        . cyclobenzaprine (FLEXERIL) 10 MG tablet Take 10 mg by mouth 2 (two) times daily as needed. For muscle pain       . DULoxetine (CYMBALTA) 60 MG capsule Take 60 mg by mouth every evening.       Marland Kitchen estradiol (ESTRACE) 1 MG tablet Take 1 mg by mouth every evening.        Boris Lown Oil 300 MG CAPS  Take 1 capsule by mouth every evening.        . medroxyPROGESTERone (PROVERA) 2.5 MG tablet Take 2.5 mg by mouth every evening.       . methylphenidate (RITALIN) 20 MG tablet Take 20 mg by mouth 2 (two) times daily.        . Multiple Vitamins-Minerals (MULTIVITAMINS THER. W/MINERALS) TABS Take 1 tablet by mouth every morning.       . pravastatin (PRAVACHOL) 20 MG tablet Take 20 mg by mouth every evening.        . zolpidem (AMBIEN) 10 MG tablet Take 10 mg by mouth at bedtime.         Past Medical History  Diagnosis Date  . High cholesterol   . Depression   . Insomnia   . Disc disorder   . Fibromyalgia   . GERD (gastroesophageal reflux disease)   . Arthritis    Past Surgical History  Procedure Date  . Facial cosmetic surgery   . Tonsillectomy   . Bunionectomy      History   Social History  . Marital Status: Married    Spouse Name: N/A    Number of Children: N/A  . Years of Education: N/A   Occupational History  . Not on file.   Social  History Main Topics  . Smoking status: Never Smoker   . Smokeless tobacco: Not on file  . Alcohol Use: No  . Drug Use: No  . Sexually Active: Not on file   Other Topics Concern  . Not on file   Social History Narrative  . No narrative on file   Family Status  Relation Status Death Age  . Mother Deceased     kidney failure, CAD  . Father Deceased     Accident   . Sister Alive     Fibromyalgia, diabetes, HTN  . Brother Deceased     CAD   Allergies  Allergen Reactions  . Lactose Intolerance (Gi)   . Strawberry Other (See Comments)    Possible allergic reaction, patient is unsure  . Latex Rash       Objective:   Physical Exam Filed Vitals:   05/21/11 1419  Height: 5\' 7"  (1.702 m)  Weight: 166 lb 4.8 oz (75.433 kg)    Alert and oriented. Skin warm and dry. Oral mucosa is moist.   . Sclera anicteric, conjunctivae is pink. Thyroid not enlarged. No cervical lymphadenopathy. Lungs clear. Heart regular rate and rhythm.   Abdomen is soft. Bowel sounds are positive. No hepatomegaly. No abdominal masses felt. No tenderness.  No edema to lower extremities. Patient is alert and oriented.      Assessment:    Genella Rife which she says is better now.  Husband  is concerned about pancreatic cancer and was reassured. I will however get amylase and lipase. She is pain free at this time.     Plan:    continue the Zegerid. Will also get a amylase and lipase.

## 2011-07-01 ENCOUNTER — Ambulatory Visit (INDEPENDENT_AMBULATORY_CARE_PROVIDER_SITE_OTHER): Payer: Medicare Other | Admitting: *Deleted

## 2011-07-01 DIAGNOSIS — I83893 Varicose veins of bilateral lower extremities with other complications: Secondary | ICD-10-CM

## 2011-07-01 DIAGNOSIS — I781 Nevus, non-neoplastic: Secondary | ICD-10-CM

## 2011-07-01 NOTE — Progress Notes (Signed)
Patient came for sclerotherapy but I did not treat her. Areas from her previous session have healed nicely. There were a few tiny spiders on the inside of both knees, but not enough to warrant spending $200 on and having to wear stockings. I suggested she return in a few years when she has more spiders to treat.

## 2012-09-27 ENCOUNTER — Encounter (INDEPENDENT_AMBULATORY_CARE_PROVIDER_SITE_OTHER): Payer: Self-pay | Admitting: *Deleted

## 2012-12-19 ENCOUNTER — Ambulatory Visit (INDEPENDENT_AMBULATORY_CARE_PROVIDER_SITE_OTHER): Payer: Medicare Other | Admitting: Internal Medicine

## 2012-12-19 ENCOUNTER — Encounter (INDEPENDENT_AMBULATORY_CARE_PROVIDER_SITE_OTHER): Payer: Self-pay | Admitting: Internal Medicine

## 2012-12-19 VITALS — BP 120/72 | HR 80 | Temp 98.8°F | Resp 16 | Ht 67.0 in | Wt 156.1 lb

## 2012-12-19 DIAGNOSIS — K219 Gastro-esophageal reflux disease without esophagitis: Secondary | ICD-10-CM

## 2012-12-19 NOTE — Progress Notes (Signed)
Presenting complaint;  Follow for chronic GERD.  Subjective:  Patient is 62 year old Caucasian female was there for scheduled visit accompanied by her husband. She has chronic GERD. She is still not doing well. She remains that daily hoarseness and chronic cough. She also has need to clear her throat frequently. She also complains of burning in her throat when she eats sweets. She also has noted sore area over her tongue 2 months ago. She does not recall having bitten her tongue. She denies dysphagia. She has intermittent heartburn. Sometimes she gets heartburn nondrinking water. She has regurgitation if she eats late. She also complains of nausea when she eats certain foods such at pizza and [place] food. She has lost 10 pounds. She states she is not trying to lose weight but she has changed her eating habits in order to help her husband lose weight. She denies vomiting melena or rectal bleeding. Her bowels move every other day. She is walking on treadmill almost daily. She had EGD in December 2012 revealing mild changes of reflux esophagitis limited to GE junction. She also had erosive antral gastritis. Esophagus was dilated because of history of dysphagia. Due to the biopsy was negative for celiac disease and H. pylori serology was negative. In December 2012 she also had upper abdominal ultrasound which was negative other than benign hepatic cysts.  Current Medications: Current Outpatient Prescriptions  Medication Sig Dispense Refill  . buPROPion (WELLBUTRIN SR) 150 MG 12 hr tablet Take 150 mg by mouth 2 (two) times daily.        Marland Kitchen estradiol (ESTRACE) 1 MG tablet Take 1 mg by mouth every evening.        Boris Lown Oil 300 MG CAPS Take 1 capsule by mouth every evening.        . medroxyPROGESTERone (PROVERA) 2.5 MG tablet Take 2.5 mg by mouth every evening.       . methylphenidate (RITALIN) 20 MG tablet Take 20 mg by mouth 2 (two) times daily.        . Multiple Vitamins-Minerals (MULTIVITAMINS THER.  W/MINERALS) TABS Take 1 tablet by mouth every morning.       Maxwell Caul Bicarbonate (ZEGERID) 20-1100 MG CAPS Take 1 capsule by mouth daily before breakfast.      . pravastatin (PRAVACHOL) 20 MG tablet Take 40 mg by mouth every evening.       . zolpidem (AMBIEN) 10 MG tablet Take 10 mg by mouth at bedtime.        . DULoxetine (CYMBALTA) 60 MG capsule Take 60 mg by mouth every evening.       Marland Kitchen omeprazole-sodium bicarbonate (ZEGERID) 40-1100 MG per capsule Take 1 capsule by mouth daily before breakfast.  60 capsule  5   No current facility-administered medications for this visit.     Objective: Blood pressure 120/72, pulse 80, temperature 98.8 F (37.1 C), temperature source Oral, resp. rate 16, height 5\' 7"  (1.702 m), weight 156 lb 1.6 oz (70.806 kg). Patient is alert and in no acute distress. Conjunctiva is pink. Sclera is nonicteric Oropharyngeal examination reveals a very small area of erythema involving the dorsal aspect of tongue to the left of midline. On palpation there is no induration. No neck masses or thyromegaly noted. Cardiac exam with regular rhythm normal S1 and S2. No murmur or gallop noted. Lungs are clear to auscultation. Abdomen. It is soft and nontender without organomegaly or masses.  No LE edema or clubbing noted.   Assessment:  #1. GERD symptoms are  poorly controlled. She has both typical and atypical symptoms. She is not candidate for metoclopramide.    Plan:  Continue anti-reflex measures and Zegerid as before. Domperidone 10 mg by mouth twice a day if [place]. She was informed that this is not FDA approved medication and it would not be covered by insurance. Pros and colon to therapy were discussed the patient and her husband and their questions were answered. Office visit in 8 weeks.

## 2012-12-19 NOTE — Patient Instructions (Signed)
Domperidone 10 mg by mouth 30 minutes before breakfast and evening meal daily. Notify when you start this medication.

## 2013-02-14 ENCOUNTER — Ambulatory Visit (INDEPENDENT_AMBULATORY_CARE_PROVIDER_SITE_OTHER): Payer: Medicare Other | Admitting: Internal Medicine

## 2013-03-31 LAB — VITAMIN B12
Hgb A1c MFr Bld: 5.7 (ref 4.0–6.0)
VITAMIN D 25-HYDROXY: 117

## 2013-04-09 ENCOUNTER — Emergency Department (HOSPITAL_COMMUNITY)
Admission: EM | Admit: 2013-04-09 | Discharge: 2013-04-09 | Disposition: A | Discharge status: Home / Self Care | Payer: Medicare Other | Attending: Emergency Medicine | Admitting: Emergency Medicine

## 2013-04-09 DIAGNOSIS — T7840XA Allergy, unspecified, initial encounter: Secondary | ICD-10-CM

## 2013-04-09 DIAGNOSIS — Z8739 Personal history of other diseases of the musculoskeletal system and connective tissue: Secondary | ICD-10-CM | POA: Insufficient documentation

## 2013-04-09 DIAGNOSIS — Z91040 Latex allergy status: Secondary | ICD-10-CM | POA: Insufficient documentation

## 2013-04-09 DIAGNOSIS — L299 Pruritus, unspecified: Secondary | ICD-10-CM | POA: Insufficient documentation

## 2013-04-09 DIAGNOSIS — T4995XA Adverse effect of unspecified topical agent, initial encounter: Secondary | ICD-10-CM | POA: Insufficient documentation

## 2013-04-09 DIAGNOSIS — R220 Localized swelling, mass and lump, head: Secondary | ICD-10-CM | POA: Insufficient documentation

## 2013-04-09 DIAGNOSIS — K219 Gastro-esophageal reflux disease without esophagitis: Secondary | ICD-10-CM | POA: Insufficient documentation

## 2013-04-09 DIAGNOSIS — E7800 Pure hypercholesterolemia, unspecified: Secondary | ICD-10-CM | POA: Insufficient documentation

## 2013-04-09 DIAGNOSIS — Z79899 Other long term (current) drug therapy: Secondary | ICD-10-CM | POA: Insufficient documentation

## 2013-04-09 DIAGNOSIS — F329 Major depressive disorder, single episode, unspecified: Secondary | ICD-10-CM | POA: Insufficient documentation

## 2013-04-09 DIAGNOSIS — F3289 Other specified depressive episodes: Secondary | ICD-10-CM | POA: Insufficient documentation

## 2013-04-09 MED ORDER — PREDNISONE 50 MG PO TABS: 50.0000 mg | ORAL_TABLET | Freq: Every day | ORAL | Status: DC

## 2013-04-09 MED ORDER — FAMOTIDINE IN NACL 20-0.9 MG/50ML-% IV SOLN
20.0000 mg | Freq: Once | INTRAVENOUS | Status: AC
  Administered 2013-04-09: 20 mg via INTRAVENOUS
  Filled 2013-04-09: qty 50

## 2013-04-09 MED ORDER — METHYLPREDNISOLONE SODIUM SUCC 125 MG IJ SOLR
125.0000 mg | Freq: Once | INTRAMUSCULAR | Status: AC
  Administered 2013-04-09: 125 mg via INTRAVENOUS
  Filled 2013-04-09: qty 2

## 2013-04-09 MED ORDER — LORAZEPAM 2 MG/ML IJ SOLN
1.0000 mg | Freq: Once | INTRAMUSCULAR | Status: AC
  Administered 2013-04-09: 1 mg via INTRAVENOUS
  Filled 2013-04-09: qty 1

## 2013-04-09 NOTE — ED Notes (Addendum)
Took 5  benadryl 20mg    Po prior to arrival to er

## 2013-04-09 NOTE — ED Notes (Signed)
Pt states she is feeling better and is ready to go home.

## 2013-04-09 NOTE — ED Notes (Signed)
Pt states she is feeling better .

## 2013-04-09 NOTE — ED Provider Notes (Signed)
CSN: [id number]     Arrival date & time 04/09/13  0507 History   First MD Initiated Contact with Patient 04/09/13 [phone]     Chief Complaint  Patient presents with  . Allergic Reaction   (Consider location/radiation/quality/duration/timing/severity/associated sxs/prior Treatment) Patient is a 62 y.o. female presenting with allergic reaction. The history is provided by the patient.  Allergic Reaction She had onset about 2 hours ago of generalized itching. She is noted that her lips were swollen. She took Benadryl 100 mg and famotidine 20 mg with only slight improvement. She's had similar reactions in the past. Her husband wonders if it might be related to pain she has from lactose intolerance. This episode and no prior episode several weeks ago occurred after taking the medication she takes to allow her to eat dairy products but she still developed some abdominal cramping.Marland Kitchen However, there are times that she can take the medication with no reaction. He over the past several years, they have not noticed any particular pattern to when see if she develops. She denies difficulty breathing or swallowing or talking.  Past Medical History  Diagnosis Date  . High cholesterol   . Depression   . Insomnia   . Disc disorder   . Fibromyalgia   . GERD (gastroesophageal reflux disease)   . Arthritis    Past Surgical History  Procedure Laterality Date  . Facial cosmetic surgery    . Tonsillectomy    . Bunionectomy     Family History  Problem Relation Age of Onset  . Colon cancer Neg Hx    History  Substance Use Topics  . Smoking status: Never Smoker   . Smokeless tobacco: Never Used  . Alcohol Use: No   OB History   Grav Para Term Preterm Abortions TAB SAB Ect Mult Living                 Review of Systems  All other systems reviewed and are negative.    Allergies  Lactose intolerance (gi); Strawberry; and Latex  Home Medications   Current Outpatient Rx  Name  Route  Sig  Dispense   Refill  . buPROPion (WELLBUTRIN SR) 150 MG 12 hr tablet   Oral   Take 150 mg by mouth 2 (two) times daily.           . DULoxetine (CYMBALTA) 60 MG capsule   Oral   Take 60 mg by mouth every evening.          Marland Kitchen estradiol (ESTRACE) 1 MG tablet   Oral   Take 1 mg by mouth every evening.           Boris Lown Oil 300 MG CAPS   Oral   Take 1 capsule by mouth every evening.           . medroxyPROGESTERone (PROVERA) 2.5 MG tablet   Oral   Take 2.5 mg by mouth every evening.          . methylphenidate (RITALIN) 20 MG tablet   Oral   Take 20 mg by mouth 2 (two) times daily.           . Multiple Vitamins-Minerals (MULTIVITAMINS THER. W/MINERALS) TABS   Oral   Take 1 tablet by mouth every morning.          Maxwell Caul Bicarbonate (ZEGERID) 20-1100 MG CAPS   Oral   Take 1 capsule by mouth daily before breakfast.         . pravastatin (  PRAVACHOL) 20 MG tablet   Oral   Take 40 mg by mouth every evening.          . zolpidem (AMBIEN) 10 MG tablet   Oral   Take 10 mg by mouth at bedtime.            BP 122/56  Pulse 73  Resp 16  Ht 5\' 6"  (1.676 m)  Wt 151 lb 8 oz (68.72 kg)  BMI 24.46 kg/m2  SpO2 100% Physical Exam  Nursing note and vitals reviewed.  61 year old female, resting comfortably and in no acute distress. Vital signs are normal. Oxygen saturation is 100%, which is normal. Head is normocephalic and atraumatic. PERRLA, EOMI. Oropharynx is clear. Lips are minimally swollen. There is no swelling of the tongue or sublingual tissue. There is no edema the uvula. She has no difficulty with secretions and phonation is normal. There is no stridor. Neck is nontender and supple without adenopathy or JVD. Back is nontender and there is no CVA tenderness. Lungs are clear without rales, wheezes, or rhonchi. Chest is nontender. Heart has regular rate and rhythm without murmur. Abdomen is soft, flat, nontender without masses or hepatosplenomegaly and  peristalsis is normoactive. Extremities have no cyanosis or edema, full range of motion is present. Skin has generalized erythema without urticaria. Neurologic: Mental status is normal, cranial nerves are intact, there are no motor or sensory deficits.  ED Course  Procedures (including critical care time)  MDM   1. Allergic reaction, initial encounter    Acute allergic reaction. I am suspicious that she may actually be allergic to her lactase which she takes so that she can eat dairy products. Given multiple episodes, I feel she should have formal allergy testing and this is been explained to the patient. In the meantime, she has already received a large dose of diphenhydramine. She's given an additional dose of famotidine as well as methylprednisolone and lorazepam and will be observed in the ED. Her husband has asked about EpiPen but I did explain to them that this is not the type of reaction that an EpiPen is useful for her.  She feels much better after above noted treatment. She is discharged with a prescription for prednisone and is advised to take over-the-counter Claritin or Zyrtec as well as over-the-counter famotidine or ranitidine.  Dione Booze, MD 04/09/13 [phone]

## 2013-08-25 ENCOUNTER — Other Ambulatory Visit (HOSPITAL_COMMUNITY): Payer: Self-pay | Admitting: Pulmonary Disease

## 2013-08-25 DIAGNOSIS — J329 Chronic sinusitis, unspecified: Secondary | ICD-10-CM

## 2013-08-29 ENCOUNTER — Ambulatory Visit (HOSPITAL_COMMUNITY)
Admission: RE | Admit: 2013-08-29 | Discharge: 2013-08-29 | Disposition: A | Discharge status: Home / Self Care | Payer: Medicare Other | Source: Ambulatory Visit | Attending: Pulmonary Disease | Admitting: Pulmonary Disease

## 2013-08-29 DIAGNOSIS — R51 Headache: Secondary | ICD-10-CM | POA: Insufficient documentation

## 2013-08-29 DIAGNOSIS — J329 Chronic sinusitis, unspecified: Secondary | ICD-10-CM

## 2013-12-20 ENCOUNTER — Ambulatory Visit (INDEPENDENT_AMBULATORY_CARE_PROVIDER_SITE_OTHER): Payer: Medicare Other | Admitting: Internal Medicine

## 2014-02-15 ENCOUNTER — Ambulatory Visit: Payer: Medicare Other | Admitting: Orthopedic Surgery

## 2014-02-26 ENCOUNTER — Ambulatory Visit (INDEPENDENT_AMBULATORY_CARE_PROVIDER_SITE_OTHER): Payer: Medicare Other | Admitting: Orthopedic Surgery

## 2014-02-26 ENCOUNTER — Ambulatory Visit (INDEPENDENT_AMBULATORY_CARE_PROVIDER_SITE_OTHER): Payer: Medicare Other

## 2014-02-26 ENCOUNTER — Encounter: Payer: Self-pay | Admitting: Orthopedic Surgery

## 2014-02-26 VITALS — BP 137/84 | Ht 66.0 in | Wt 151.0 lb

## 2014-02-26 DIAGNOSIS — M25512 Pain in left shoulder: Secondary | ICD-10-CM

## 2014-02-26 DIAGNOSIS — M7540 Impingement syndrome of unspecified shoulder: Secondary | ICD-10-CM | POA: Insufficient documentation

## 2014-02-26 DIAGNOSIS — M7542 Impingement syndrome of left shoulder: Secondary | ICD-10-CM

## 2014-02-26 NOTE — Patient Instructions (Addendum)
Impingement Syndrome, Rotator Cuff, Bursitis with Rehab Impingement syndrome is a condition that involves inflammation of the tendons of the rotator cuff and the subacromial bursa, that causes pain in the shoulder. The rotator cuff consists of four tendons and muscles that control much of the shoulder and upper arm function. The subacromial bursa is a fluid filled sac that helps reduce friction between the rotator cuff and one of the bones of the shoulder (acromion). Impingement syndrome is usually an overuse injury that causes swelling of the bursa (bursitis), swelling of the tendon (tendonitis), and/or a tear of the tendon (strain). Strains are classified into three categories. Grade 1 strains cause pain, but the tendon is not lengthened. Grade 2 strains include a lengthened ligament, due to the ligament being stretched or partially ruptured. With grade 2 strains there is still function, although the function may be decreased. Grade 3 strains include a complete tear of the tendon or muscle, and function is usually impaired. SYMPTOMS   Pain around the shoulder, often at the outer portion of the upper arm.  Pain that gets worse with shoulder function, especially when reaching overhead or lifting.  Sometimes, aching when not using the arm.  Pain that wakes you up at night.  Sometimes, tenderness, swelling, warmth, or redness over the affected area.  Loss of strength.  Limited motion of the shoulder, especially reaching behind the back (to the back pocket or to unhook bra) or across your body.  Crackling sound (crepitation) when moving the arm.  Biceps tendon pain and inflammation (in the front of the shoulder). Worse when bending the elbow or lifting. CAUSES  Impingement syndrome is often an overuse injury, in which chronic (repetitive) motions cause the tendons or bursa to become inflamed. A strain occurs when a force is paced on the tendon or muscle that is greater than it can withstand.  Common mechanisms of injury include: Stress from sudden increase in duration, frequency, or intensity of training.  Direct hit (trauma) to the shoulder.  Aging, erosion of the tendon with normal use.  Bony bump on shoulder (acromial spur). RISK INCREASES WITH:  Contact sports (football, wrestling, boxing).  Throwing sports (baseball, tennis, volleyball).  Weightlifting and bodybuilding.  Heavy labor.  Previous injury to the rotator cuff, including impingement.  Poor shoulder strength and flexibility.  Failure to warm up properly before activity.  Inadequate protective equipment.  Old age.  Bony bump on shoulder (acromial spur). PREVENTION   Warm up and stretch properly before activity.  Allow for adequate recovery between workouts.  Maintain physical fitness:  Strength, flexibility, and endurance.  Cardiovascular fitness.  Learn and use proper exercise technique. PROGNOSIS  If treated properly, impingement syndrome usually goes away within 6 weeks. Sometimes surgery is required.  RELATED COMPLICATIONS   Longer healing time if not properly treated, or if not given enough time to heal.  Recurring symptoms, that result in a chronic condition.  Shoulder stiffness, frozen shoulder, or loss of motion.  Rotator cuff tendon tear.  Recurring symptoms, especially if activity is resumed too soon, with overuse, with a direct blow, or when using poor technique. TREATMENT  Treatment first involves the use of ice and medicine, to reduce pain and inflammation. The use of strengthening and stretching exercises may help reduce pain with activity. These exercises may be performed at home or with a therapist. If non-surgical treatment is unsuccessful after more than 6 months, surgery may be advised. After surgery and rehabilitation, activity is usually possible in 3 months.  MEDICATION  If pain medicine is needed, nonsteroidal anti-inflammatory medicines (aspirin and  ibuprofen), or other minor pain relievers (acetaminophen), are often advised.  Do not take pain medicine for 7 days before surgery.  Prescription pain relievers may be given, if your caregiver thinks they are needed. Use only as directed and only as much as you need.  Corticosteroid injections may be given by your caregiver. These injections should be reserved for the most serious cases, because they may only be given a certain number of times. HEAT AND COLD  Cold treatment (icing) should be applied for 10 to 15 minutes every 2 to 3 hours for inflammation and pain, and immediately after activity that aggravates your symptoms. Use ice packs or an ice massage.  Heat treatment may be used before performing stretching and strengthening activities prescribed by your caregiver, physical therapist, or athletic trainer. Use a heat pack or a warm water soak. SEEK MEDICAL CARE IF:   Symptoms get worse or do not improve in 4 to 6 weeks, despite treatment.  New, unexplained symptoms develop. (Drugs used in treatment may produce side effects.)  You have received an injection of steroids into the joint. 15% of patients will have increased pain within the 24 hours postinjection.   This is transient and will go away.   We recommend that you use ice packs on the injection site for 20 minutes every 2 hours and extra strength Tylenol 2 tablets every 8 as needed until the pain resolves.  If you continue to have pain after taking the Tylenol and using the ice please call the office for further instructions.

## 2014-02-26 NOTE — Progress Notes (Signed)
Patient ID: SHEMEIKA STARZYK, female   DOB: 14-Jan-1951, 63 y.o.   MRN: [id number] Patient ID: AHMAYA OSTERMILLER, female   DOB: 08/31/1950, 63 y.o.   MRN: [id number]  Chief Complaint  Patient presents with  . Shoulder Injury    left shoulder pain, no known injury    HPI Teresa [name] is a 63 y.o. female. This 63 year old female had right shoulder injection over 3 years ago for her situs impingement did well presents now with a one-year history of H medical onset of sharp throbbing burning stabbing aching pain in the left shoulder radiating to the left elbow but without numbness or tingling. Pain is constant worse after activity associated with forward elevation and reaching behind her back. She rates her pain 8 out of 10 and she does note some decreased range of motion unresponsive to Advil area  HPI  Past Medical History  Diagnosis Date  . High cholesterol   . Depression   . Insomnia   . Disc disorder   . Fibromyalgia   . GERD (gastroesophageal reflux disease)   . Arthritis     Past Surgical History  Procedure Laterality Date  . Facial cosmetic surgery    . Tonsillectomy    . Bunionectomy      Family History  Problem Relation Age of Onset  . Colon cancer Neg Hx     Social History History  Substance Use Topics  . Smoking status: Never Smoker   . Smokeless tobacco: Never Used  . Alcohol Use: No    Allergies  Allergen Reactions  . Lactose Intolerance (Gi)   . Strawberry Other (See Comments)    Possible allergic reaction, patient is unsure  . Latex Rash    Current Outpatient Prescriptions  Medication Sig Dispense Refill  . buPROPion (WELLBUTRIN SR) 150 MG 12 hr tablet Take 150 mg by mouth 2 (two) times daily.      . DULoxetine (CYMBALTA) 60 MG capsule Take 60 mg by mouth every evening.     Marland Kitchen estradiol (ESTRACE) 1 MG tablet Take 1 mg by mouth every evening.      Javier Docker Oil 300 MG CAPS Take 1 capsule by mouth every evening.      . medroxyPROGESTERone (PROVERA) 2.5 MG  tablet Take 2.5 mg by mouth every evening.     . methylphenidate (RITALIN) 20 MG tablet Take 20 mg by mouth 2 (two) times daily.      . Multiple Vitamins-Minerals (MULTIVITAMINS THER. W/MINERALS) TABS Take 1 tablet by mouth every morning.     Earney Navy Bicarbonate (ZEGERID) 20-1100 MG CAPS Take 1 capsule by mouth daily before breakfast.    . pravastatin (PRAVACHOL) 20 MG tablet Take 40 mg by mouth every evening.     . predniSONE (DELTASONE) 50 MG tablet Take 1 tablet (50 mg total) by mouth daily with breakfast. 5 tablet 0  . zolpidem (AMBIEN) 10 MG tablet Take 10 mg by mouth at bedtime.       No current facility-administered medications for this visit.    Review of Systems Review of Systems Positive review of systems findings include sinusitis cough joint and limb pain swollen joints back pain stiff joints food allergy. She has a significant meet allergy from a tick bite and she's had 3 anaphylactic reactions and she has an EpiPen for that the remaining review of systems have been normal Blood pressure 137/84, height 5\' 6"  (1.676 m), weight 151 lb (68.493 kg).  Physical Exam Physical Exam Vital  signs are stable as recorded. She appears to be normally developed and normally groomed. She is oriented 3. Her mood is pleasant her affect is normal. Oriented 3. HEENT and station are normal.  Right shoulder exam shows no tenderness full range of motion negative apprehension strong rotator cuff skin intact good pulse normal sensation 2+ reflexes at the elbow and wrist  Left shoulder tenderness to palpation around the acromion with decreased external rotation internal rotation and forward elevation. Negative apprehension. Positive impingement sign. Normal strength in the supraspinatus tendon grade 5. Skin intact. Pulse normal. Sensation normal and reflexes are 2+ Data Reviewed Independent x-ray interpretation X-ray is negative   Assessment    Encounter Diagnoses  Name Primary?  .  Left shoulder pain   . Shoulder impingement syndrome, left Yes        Plan    She agreed to them was given subacromial injection she was held in the office for 5 minutes with no reaction she is to do Codman exercises follow-up as needed  Procedure note the subacromial injection shoulder LEFT   Verbal consent was obtained to inject the  LEFT  Shoulder  Timeout was completed to confirm the injection site is a subacromial space of the  LEFT shoulder   Medication used Depo-Medrol 40 mg and lidocaine 1% 3 cc  Anesthesia was provided by ethyl chloride  The injection was performed in the LEFT posterior subacromial space. After pinning the skin with alcohol and anesthetized the skin with ethyl chloride the subacromial space was injected using a 20-gauge needle. There were no complications  Sterile dressing was applied.           Arther Abbott 02/26/2014, 10:19 AM

## 2014-07-02 ENCOUNTER — Ambulatory Visit: Payer: Self-pay | Admitting: Orthopedic Surgery

## 2014-07-18 ENCOUNTER — Ambulatory Visit (INDEPENDENT_AMBULATORY_CARE_PROVIDER_SITE_OTHER): Payer: PPO | Admitting: Orthopedic Surgery

## 2014-07-18 VITALS — BP 143/85 | Ht 66.0 in | Wt 151.0 lb

## 2014-07-18 DIAGNOSIS — M75102 Unspecified rotator cuff tear or rupture of left shoulder, not specified as traumatic: Secondary | ICD-10-CM | POA: Diagnosis not present

## 2014-07-18 NOTE — Patient Instructions (Signed)

## 2014-07-19 ENCOUNTER — Encounter: Payer: Self-pay | Admitting: Orthopedic Surgery

## 2014-07-19 NOTE — Progress Notes (Signed)
Patient ID: Teresa [name], female   DOB: 08/31/1950, 64 y.o.   MRN: [id number] Chief Complaint  Patient presents with  . Shoulder Pain    re- eval bilateral shoulder pain, left>right    The patient presents back for reevaluation of left shoulder rotator cuff syndrome status post injection. She had a history of right rotator cuff syndrome which resolved with injection.  Status post injection November 2015 presents back with incomplete relief of pain continued painful [place] elevation inability to sleep on her left shoulder without weakness  Review of systems negative for other musculoskeletal problems  Vital signs stable as recorded appearance is normal she is awake alert and oriented 3 mood is normal. She has tenderness in the peri-acromial region. She has anterior rotator interval tenderness and posterior subacromial tenderness she has decreased internal rotation full forward elevation with pain normal motor exam stable in abduction external rotation neurovascular exam intact  Previous x-ray showed no acute problems with the bony anatomy  Impression rotator cuff syndrome  Recommend injection exercise versus MRI and possible decompression. The patient would like to try injection exercise again  Her left shoulder was injected in subacromial space. She will follow-up in 6 weeks.  Procedure note the subacromial injection shoulder left   Verbal consent was obtained to inject the  Left   Shoulder  Timeout was completed to confirm the injection site is a subacromial space of the  left  shoulder  Medication used Depo-Medrol 40 mg and lidocaine 1% 3 cc  Anesthesia was provided by ethyl chloride  The injection was performed in the left  posterior subacromial space. After pinning the skin with alcohol and anesthetized the skin with ethyl chloride the subacromial space was injected using a 20-gauge needle. There were no complications  Sterile dressing was applied.

## 2014-08-30 ENCOUNTER — Ambulatory Visit: Payer: PPO | Admitting: Orthopedic Surgery

## 2014-09-20 ENCOUNTER — Ambulatory Visit: Payer: PPO | Admitting: Orthopedic Surgery

## 2014-10-04 ENCOUNTER — Ambulatory Visit: Payer: PPO | Admitting: Orthopedic Surgery

## 2014-10-04 ENCOUNTER — Encounter: Payer: Self-pay | Admitting: Orthopedic Surgery

## 2015-05-07 ENCOUNTER — Encounter (INDEPENDENT_AMBULATORY_CARE_PROVIDER_SITE_OTHER): Payer: Self-pay | Admitting: Internal Medicine

## 2015-05-07 ENCOUNTER — Ambulatory Visit (INDEPENDENT_AMBULATORY_CARE_PROVIDER_SITE_OTHER): Payer: PPO | Admitting: Internal Medicine

## 2015-05-07 VITALS — BP 148/68 | HR 76 | Temp 98.4°F | Resp 18 | Ht 67.0 in | Wt 156.2 lb

## 2015-05-07 DIAGNOSIS — K2100 Gastro-esophageal reflux disease with esophagitis, without bleeding: Secondary | ICD-10-CM

## 2015-05-07 MED ORDER — RANITIDINE HCL 150 MG PO TABS: 150.0000 mg | ORAL_TABLET | Freq: Every day | ORAL | Status: DC | PRN

## 2015-05-07 NOTE — Patient Instructions (Signed)
Can take ranitidine or Zantac 150 mg OTC every evening or at bedtime as needed.

## 2015-05-07 NOTE — Progress Notes (Signed)
Presenting complaint;  Follow-up for GERD.  Subjective:  Patient is 65 year old Caucasian female was therefore scheduled visit. She has chronic GERD and was last seen on 12/19/2012. She had EGD with ED back in December 2012 revealing mild changes of reflux esophagitis at GE junction and she also had gastritis but H. pylori serology was negative. She states she was doing well and decided to stop her centigrade after Thanksgiving. She did try Zantac in the meantime but her symptoms have relapsed. She has daily heartburn and nocturnal regurgitation. She eats her supper at 4 PM and she has head and of the bed elevated by placing pillows underneath the mattress. She has gone back on Zegerid but is not working like it was. She is watching her diet. She does not do any physical activity on account of fibromyalgia and arthritis. She feels she is not getting enough calories. She is eating one complete cookie a day which provides her 16 g of protein.  She has not lost any weight since her last visit of September 2014. She denies melena or rectal bleeding or abdominal pain. Her bowels move daily. Last colonoscopy was in August 2007 to    Current Medications: Outpatient Encounter Prescriptions as of 05/07/2015  Medication Sig  . buPROPion (WELLBUTRIN SR) 150 MG 12 hr tablet Take 150 mg by mouth 2 (two) times daily.    . DULoxetine (CYMBALTA) 60 MG capsule Take 60 mg by mouth every evening.   Marland Kitchen estradiol (ESTRACE) 1 MG tablet Take 1 mg by mouth every evening.    . medroxyPROGESTERone (PROVERA) 2.5 MG tablet Take 2.5 mg by mouth every evening.   . methylphenidate (RITALIN) 20 MG tablet Take 20 mg by mouth 2 (two) times daily.    . Multiple Vitamins-Minerals (MULTIVITAMINS THER. W/MINERALS) TABS Take 1 tablet by mouth every morning.   Earney Navy Bicarbonate (ZEGERID) 20-1100 MG CAPS Take 1 capsule by mouth daily before breakfast.  . pravastatin (PRAVACHOL) 20 MG tablet Take 40 mg by mouth every  evening.   . temazepam (RESTORIL) 15 MG capsule Take 15 mg by mouth at bedtime.  Marland Kitchen zolpidem (AMBIEN) 10 MG tablet Take 10 mg by mouth at bedtime.    . [DISCONTINUED] Krill Oil 300 MG CAPS Take 1 capsule by mouth every evening. Reported on 05/07/2015  . [DISCONTINUED] predniSONE (DELTASONE) 50 MG tablet Take 1 tablet (50 mg total) by mouth daily with breakfast. (Patient not taking: Reported on 05/07/2015)   No facility-administered encounter medications on file as of 05/07/2015.     Objective: Blood pressure 148/68, pulse 76, temperature 98.4 F (36.9 C), temperature source Oral, resp. rate 18, height 5\' 7"  (1.702 m), weight 156 lb 3.2 oz (70.852 kg). Patient is alert and in no acute distress. Conjunctiva is pink. Sclera is nonicteric Oropharyngeal mucosa is normal. No neck masses or thyromegaly noted. Cardiac exam with regular rhythm normal S1 and S2. No murmur or gallop noted. Lungs are clear to auscultation. Abdomen abdomen is symmetrical soft and nontender without organomegaly or masses.  No LE edema or clubbing noted.  Labs/studies Results: H. pylori serology negative on 03/19/2011.  Assessment:  #1. Chronic GERD. She stopped Zegrid 2 months ago with flareup of her symptoms but going back on the same dose of medication is not helping. She does not have alarm symptoms. She may have to be switched to another PPI. If symptoms persists she may need testing to rule out gastroparesis. #2. CRC screening. She is average risk for CRC. Last colonoscopy  was in August 2007 and she will be due for screening colonoscopy this fall.   Plan:  Increase Zegrid OTC to 2 capsules every morning. Continue ranitidine 150 mg by mouth daily at bedtime when necessary. Patient will review her booklet and let us know which PPI is covered in which case she will be switched to different PPI. Screening colonoscopy sometime this year. Office visit in 2 months.

## 2015-05-21 ENCOUNTER — Telehealth (INDEPENDENT_AMBULATORY_CARE_PROVIDER_SITE_OTHER): Payer: Self-pay | Admitting: Internal Medicine

## 2015-05-21 NOTE — Telephone Encounter (Signed)
Ms. Chaparro called saying she confirmed with her insurance that they'll cover the cost of Pantoprazole Sodium Delayed Response Tablets 20 or 40mg . She's currently taking Zegerid and said Dr. Laural Golden wanted to know what other medication the insurance would cover in it's place. She believes since she takes two Zegerid's in the morning, she'll need the 40mg  of Pantoprazole. She's wondering if the Rx for Pantoprazole can be sent to her [facility] in [place] today. Please give the patient a call if needed.  Pt's ph# [phone] Thank you.

## 2015-05-22 ENCOUNTER — Other Ambulatory Visit (INDEPENDENT_AMBULATORY_CARE_PROVIDER_SITE_OTHER): Payer: Self-pay | Admitting: *Deleted

## 2015-05-22 DIAGNOSIS — R921 Mammographic calcification found on diagnostic imaging of breast: Secondary | ICD-10-CM | POA: Diagnosis not present

## 2015-05-22 DIAGNOSIS — R928 Other abnormal and inconclusive findings on diagnostic imaging of breast: Secondary | ICD-10-CM | POA: Diagnosis not present

## 2015-05-22 MED ORDER — PANTOPRAZOLE SODIUM 40 MG PO TBEC: 40.0000 mg | DELAYED_RELEASE_TABLET | Freq: Every day | ORAL | Status: DC

## 2015-05-22 NOTE — Telephone Encounter (Signed)
A rx has been sent to the patient 's pharmacy. She was called and made aware.

## 2015-05-22 NOTE — Telephone Encounter (Signed)
Patient found out that her insurance would cover Pantoprazole and a Rx has been sent. Per Dr.Rehman's Ov note this would be done when we found out what they would cover.

## 2015-08-23 DIAGNOSIS — M797 Fibromyalgia: Secondary | ICD-10-CM | POA: Diagnosis not present

## 2015-08-23 DIAGNOSIS — M5030 Other cervical disc degeneration, unspecified cervical region: Secondary | ICD-10-CM | POA: Diagnosis not present

## 2015-08-23 DIAGNOSIS — K210 Gastro-esophageal reflux disease with esophagitis: Secondary | ICD-10-CM | POA: Diagnosis not present

## 2015-08-23 DIAGNOSIS — G4700 Insomnia, unspecified: Secondary | ICD-10-CM | POA: Diagnosis not present

## 2015-08-28 DIAGNOSIS — R921 Mammographic calcification found on diagnostic imaging of breast: Secondary | ICD-10-CM | POA: Diagnosis not present

## 2015-09-02 ENCOUNTER — Other Ambulatory Visit (HOSPITAL_COMMUNITY): Payer: Self-pay | Admitting: Pulmonary Disease

## 2015-09-02 DIAGNOSIS — M5030 Other cervical disc degeneration, unspecified cervical region: Secondary | ICD-10-CM

## 2015-09-10 ENCOUNTER — Ambulatory Visit (HOSPITAL_COMMUNITY)
Admission: RE | Admit: 2015-09-10 | Discharge: 2015-09-10 | Disposition: A | Discharge status: Home / Self Care | Payer: PPO | Source: Ambulatory Visit | Attending: Pulmonary Disease | Admitting: Pulmonary Disease

## 2015-09-10 DIAGNOSIS — M47892 Other spondylosis, cervical region: Secondary | ICD-10-CM | POA: Diagnosis not present

## 2015-09-10 DIAGNOSIS — M5030 Other cervical disc degeneration, unspecified cervical region: Secondary | ICD-10-CM | POA: Insufficient documentation

## 2015-09-10 LAB — POCT I-STAT CREATININE: CREATININE: 0.8 mg/dL (ref 0.44–1.00)

## 2015-09-10 MED ORDER — IOPAMIDOL (ISOVUE-300) INJECTION 61%
75.0000 mL | Freq: Once | INTRAVENOUS | Status: AC | PRN
  Administered 2015-09-10: 75 mL via INTRAVENOUS

## 2015-09-30 ENCOUNTER — Ambulatory Visit (INDEPENDENT_AMBULATORY_CARE_PROVIDER_SITE_OTHER): Payer: PPO | Admitting: Otolaryngology

## 2015-09-30 DIAGNOSIS — K1121 Acute sialoadenitis: Secondary | ICD-10-CM

## 2015-12-04 ENCOUNTER — Ambulatory Visit (INDEPENDENT_AMBULATORY_CARE_PROVIDER_SITE_OTHER): Payer: PPO

## 2015-12-04 ENCOUNTER — Ambulatory Visit (INDEPENDENT_AMBULATORY_CARE_PROVIDER_SITE_OTHER): Payer: PPO | Admitting: Orthopedic Surgery

## 2015-12-04 ENCOUNTER — Encounter: Payer: Self-pay | Admitting: Orthopedic Surgery

## 2015-12-04 VITALS — BP 144/71 | HR 89 | Ht 66.0 in | Wt 156.0 lb

## 2015-12-04 DIAGNOSIS — M1811 Unilateral primary osteoarthritis of first carpometacarpal joint, right hand: Secondary | ICD-10-CM | POA: Diagnosis not present

## 2015-12-04 DIAGNOSIS — M79644 Pain in right finger(s): Secondary | ICD-10-CM

## 2015-12-04 MED ORDER — DICLOFENAC SODIUM 1 % TD GEL: 2.0000 g | Freq: Four times a day (QID) | TRANSDERMAL | Status: AC

## 2015-12-04 NOTE — Patient Instructions (Signed)
WEAR BRACE X 6 WEEKS   START TOPICAL VOLTAREN GEL

## 2015-12-04 NOTE — Progress Notes (Signed)
Chief Complaint  Patient presents with  . New Patient (Initial Visit)    Right thumb pain   HPI  65 year old female presents for evaluation of her painful right thumb. She's had symptoms for 6-12 months with no history of trauma. She complains of sharp dull throbbing burning pain swelling and stiffness which is become constant and has reached the level of 6 out of 10 with no prior treatment and pain is over the [facility] joint  Review of Systems  Constitutional: Positive for malaise/fatigue.  HENT: Positive for congestion, hearing loss and sore throat.   Gastrointestinal: Positive for constipation.  Musculoskeletal: Positive for back pain, falls, joint pain, myalgias and neck pain.  Neurological: Positive for tingling and sensory change.  Endo/Heme/Allergies: Positive for environmental allergies.  Psychiatric/Behavioral: Positive for depression.    Past Medical History:  Diagnosis Date  . Arthritis   . Depression   . Disc disorder   . Fibromyalgia   . GERD (gastroesophageal reflux disease)   . High cholesterol   . Insomnia     Past Surgical History:  Procedure Laterality Date  . BUNIONECTOMY    . FACIAL COSMETIC SURGERY    . TONSILLECTOMY     Family History  Problem Relation Age of Onset  . Diabetes Mother   . Stroke Mother   . Kidney disease Mother   . Depression Mother   . Heart disease Mother   . Hypertension Mother   . Clotting disorder Mother   . Arthritis Mother   . Osteoporosis Mother   . Ulcers Father   . GER disease Father   . Alcohol abuse Father   . Diabetes Sister   . Hypertension Sister   . Arthritis Sister   . Asthma Maternal Grandmother   . Colon cancer Neg Hx    Social History  Substance Use Topics  . Smoking status: Never Smoker  . Smokeless tobacco: Never Used  . Alcohol use No    Current Outpatient Prescriptions:  .  buPROPion (WELLBUTRIN SR) 150 MG 12 hr tablet, Take 150 mg by mouth 2 (two) times daily.  , Disp: , Rfl:  .  DULoxetine  (CYMBALTA) 60 MG capsule, Take 60 mg by mouth every evening. , Disp: , Rfl:  .  methylphenidate (RITALIN) 20 MG tablet, Take 20 mg by mouth 2 (two) times daily.  , Disp: , Rfl:  .  Multiple Vitamins-Minerals (MULTIVITAMINS THER. W/MINERALS) TABS, Take 1 tablet by mouth every morning. , Disp: , Rfl:  .  Omega-3 Fatty Acids (FISH OIL PO), Take by mouth., Disp: , Rfl:  .  pantoprazole (PROTONIX) 40 MG tablet, Take 1 tablet (40 mg total) by mouth daily., Disp: 90 tablet, Rfl: 3 .  pravastatin (PRAVACHOL) 20 MG tablet, Take 40 mg by mouth every evening. , Disp: , Rfl:  .  temazepam (RESTORIL) 15 MG capsule, Take 15 mg by mouth at bedtime., Disp: , Rfl:  .  Vitamin D, Ergocalciferol, (DRISDOL) 50000 units CAPS capsule, Take 50,000 Units by mouth every 7 (seven) days., Disp: , Rfl:  .  zolpidem (AMBIEN) 10 MG tablet, Take 10 mg by mouth at bedtime.  , Disp: , Rfl:   BP (!) 144/71   Pulse 89   Ht 5\' 6"  (1.676 m)   Wt 156 lb (70.8 kg)   BMI 25.18 kg/m   Physical Exam  Constitutional: She is oriented to person, place, and time. She appears well-developed and well-nourished. No distress.  Cardiovascular: Normal rate and intact distal pulses.  Neurological: She is alert and oriented to person, place, and time. She has normal reflexes. She exhibits normal muscle tone. Coordination normal.  Skin: Skin is warm and dry. No rash noted. She is not diaphoretic. No erythema. No pallor.  Psychiatric: She has a normal mood and affect. Her behavior is normal. Judgment and thought content normal.    Ortho Exam Left thumb range of motion is normal there is no tenderness at the [facility] joint pinch strength is normal thumb is stable skin is intact she has good capillary refill and normal sensation  Right thumb crepitance and tenderness at the [facility] joint of the thumb normal but painful range of motion week pinch strength. The joint is stable skin is normal pulse and perfusion remain intact there are no sensory deficits.  Harriet Pho test is negative  ASSESSMENT: My personal interpretation of the images:  MILD OA BASE OF [facility] # 1     PLAN SPLINT  NSAIDS  6 WEEKS F/U     Arther Abbott, MD 12/04/2015 4:27 PM

## 2015-12-09 DIAGNOSIS — M9905 Segmental and somatic dysfunction of pelvic region: Secondary | ICD-10-CM | POA: Diagnosis not present

## 2015-12-09 DIAGNOSIS — M9903 Segmental and somatic dysfunction of lumbar region: Secondary | ICD-10-CM | POA: Diagnosis not present

## 2015-12-09 DIAGNOSIS — M9902 Segmental and somatic dysfunction of thoracic region: Secondary | ICD-10-CM | POA: Diagnosis not present

## 2015-12-09 DIAGNOSIS — M5442 Lumbago with sciatica, left side: Secondary | ICD-10-CM | POA: Diagnosis not present

## 2015-12-11 DIAGNOSIS — M9903 Segmental and somatic dysfunction of lumbar region: Secondary | ICD-10-CM | POA: Diagnosis not present

## 2015-12-11 DIAGNOSIS — M9905 Segmental and somatic dysfunction of pelvic region: Secondary | ICD-10-CM | POA: Diagnosis not present

## 2015-12-11 DIAGNOSIS — M9902 Segmental and somatic dysfunction of thoracic region: Secondary | ICD-10-CM | POA: Diagnosis not present

## 2015-12-11 DIAGNOSIS — M5442 Lumbago with sciatica, left side: Secondary | ICD-10-CM | POA: Diagnosis not present

## 2016-01-14 ENCOUNTER — Ambulatory Visit: Payer: PPO | Admitting: Orthopedic Surgery

## 2016-02-06 DIAGNOSIS — M1811 Unilateral primary osteoarthritis of first carpometacarpal joint, right hand: Secondary | ICD-10-CM | POA: Diagnosis not present

## 2016-02-10 ENCOUNTER — Encounter: Payer: Self-pay | Admitting: "Endocrinology

## 2016-02-10 ENCOUNTER — Ambulatory Visit (INDEPENDENT_AMBULATORY_CARE_PROVIDER_SITE_OTHER): Payer: PPO | Admitting: "Endocrinology

## 2016-02-10 VITALS — BP 137/82 | HR 81 | Ht 67.0 in | Wt 147.0 lb

## 2016-02-10 DIAGNOSIS — M5442 Lumbago with sciatica, left side: Secondary | ICD-10-CM | POA: Diagnosis not present

## 2016-02-10 DIAGNOSIS — M9902 Segmental and somatic dysfunction of thoracic region: Secondary | ICD-10-CM | POA: Diagnosis not present

## 2016-02-10 DIAGNOSIS — M9903 Segmental and somatic dysfunction of lumbar region: Secondary | ICD-10-CM | POA: Diagnosis not present

## 2016-02-10 DIAGNOSIS — M9905 Segmental and somatic dysfunction of pelvic region: Secondary | ICD-10-CM | POA: Diagnosis not present

## 2016-02-10 DIAGNOSIS — E042 Nontoxic multinodular goiter: Secondary | ICD-10-CM

## 2016-02-10 NOTE — Progress Notes (Signed)
Subjective:    Patient ID: Teresa [name], female    DOB: 1950/09/21, PCP Alonza Bogus, MD   Past Medical History:  Diagnosis Date  . Arthritis   . Depression   . Disc disorder   . Fibromyalgia   . GERD (gastroesophageal reflux disease)   . High cholesterol   . Insomnia    Past Surgical History:  Procedure Laterality Date  . BUNIONECTOMY    . FACIAL COSMETIC SURGERY    . TONSILLECTOMY     Social History   Social History  . Marital status: Married    Spouse name: N/A  . Number of children: N/A  . Years of education: N/A   Social History Main Topics  . Smoking status: Never Smoker  . Smokeless tobacco: Never Used  . Alcohol use No  . Drug use: No  . Sexual activity: Not on file   Other Topics Concern  . Not on file   Social History Narrative  . No narrative on file   Outpatient Encounter Prescriptions as of 02/10/2016  Medication Sig  . buPROPion (WELLBUTRIN SR) 150 MG 12 hr tablet Take 150 mg by mouth 2 (two) times daily.    . DULoxetine (CYMBALTA) 60 MG capsule Take 60 mg by mouth every evening.   . Ginger, Zingiber officinalis, (GINGER PO) Take by mouth.  . Multiple Vitamins-Minerals (MULTIVITAMINS THER. W/MINERALS) TABS Take 1 tablet by mouth every morning.   . Omega-3 Fatty Acids (FISH OIL PO) Take by mouth.  Marland Kitchen OVER THE COUNTER MEDICATION Cumin 500 mg qd  . pantoprazole (PROTONIX) 40 MG tablet Take 1 tablet (40 mg total) by mouth daily.  . pravastatin (PRAVACHOL) 20 MG tablet Take 40 mg by mouth every evening.   . temazepam (RESTORIL) 15 MG capsule Take 15 mg by mouth at bedtime.  . Vitamin D, Ergocalciferol, (DRISDOL) 50000 units CAPS capsule Take 50,000 Units by mouth every 7 (seven) days.  . methylphenidate (RITALIN) 20 MG tablet Take 20 mg by mouth 2 (two) times daily.    . [DISCONTINUED] zolpidem (AMBIEN) 10 MG tablet Take 10 mg by mouth at bedtime.     Facility-Administered Encounter Medications as of 02/10/2016  Medication  . diclofenac  sodium (VOLTAREN) 1 % transdermal gel 2 g   ALLERGIES: Allergies  Allergen Reactions  . Meat Extract Anaphylaxis    Any meat at all BITTEN BY TICK-DEVELOPED MAMMAL MEAT ALLERGY   . Lactose Intolerance (Gi)   . Strawberry Extract Other (See Comments)    Possible allergic reaction, patient is unsure  . Latex Rash   VACCINATION STATUS:  There is no immunization history on file for this patient.  HPI  65 year old female patient with medical history as above. She is being seen in consultation for history of multinodular goiter requested by Dr. Sinda Du. - Target her records she was known to have multinodular goiter from 2009 which did not require any intervention. In 2010, when she had her last ultrasound the right lobe was 4.6 cm with a 9 mm nodule which was reported to be stable and left lobe 4.7 cm with stable 3m cm nodule. She did not have any subsequent surveillance thyroid ultrasound. She denies dysphagia, shortness of breath, change in her voice. She denies any exposure to neck radiation. She has family history of benign multinodular goiter in one of her grandparents. She does not have recent thyroid function tests. She reports intermittent palpitations, tremors, heat intolerance/ hot Flushes. - She lost approximately 12 pounds  in the last year largely unintentional.  Review of Systems Constitutional: +weight loss, + fatigue, no subjective hyperthermia Eyes: no blurry vision, no xerophthalmia ENT: no sore throat, no nodules palpated in throat, no dysphagia/odynophagia, no hoarseness Cardiovascular: no CP/SOB, +palpitations, leg swelling Respiratory: no cough/SOB Gastrointestinal: no N/V/D/C Musculoskeletal: no muscle/joint aches Skin: no rashes Neurological: + tremors, -numbness/tingling/dizziness Psychiatric: no depression/anxiety  Objective:    BP 137/82   Pulse 81   Ht '5\' 7"'$  (1.702 m)   Wt 147 lb (66.7 kg)   BMI 23.02 kg/m   Wt Readings from Last 3 Encounters:   02/10/16 147 lb (66.7 kg)  12/04/15 156 lb (70.8 kg)  05/07/15 156 lb 3.2 oz (70.9 kg)    Physical Exam Constitutional:  in NAD Eyes: PERRLA, EOMI, no exophthalmos ENT: moist mucous membranes, no thyromegaly, no cervical lymphadenopathy Cardiovascular: RRR, No MRG Respiratory: CTA B Gastrointestinal: abdomen soft, NT, ND, BS+ Musculoskeletal: no deformities, strength intact in all 4 Skin: moist, warm, no rashes Neurological: + tremor with outstretched hands, DTR normal in all 4  CMP     Component Value Date/Time   NA 141 06/19/2009 1158   K 4.8 06/19/2009 1158   CL 105 06/19/2009 1158   CO2 30 06/19/2009 1158   GLUCOSE 101 (H) 06/19/2009 1158   BUN 9 06/19/2009 1158   CREATININE 0.80 09/10/2015 1426   CALCIUM 9.5 06/19/2009 1158   GFRNONAA >60 09/17/2007 1905   GFRAA  09/17/2007 1905    >60        The eGFR has been calculated using the MDRD equation. This calculation has not been validated in all clinical   2010 thyroid ultrasound showed right lobe 4.6 cm with a 9 mm nodule and left lobe 4.7 cm with 9 mm stable nodules.  Assessment & Plan:   1. Multinodular goiter - I have reviewed her available thyroid workup records and clinically evaluated the patient. Clinically there are subtle signs of hyperthyroidism. However, I would like to proceed to obtain new set of full profile thyroid function test today . she will also need surveillance thyroid/neck ultrasound to monitor nodular growth. If any of those nodules or greater than 1 cm, she will be considered for fine needle aspiration.  - I advised patient to maintain close follow up with HAWKINS,EDWARD L, MD for primary care needs. Follow up plan: Return in about 2 weeks (around 02/24/2016) for labs today, Thyroid / Neck Ultrasound.  Glade Lloyd, MD Phone: [phone]  Fax: [phone]   02/10/2016, 4:26 PM

## 2016-02-11 ENCOUNTER — Ambulatory Visit (INDEPENDENT_AMBULATORY_CARE_PROVIDER_SITE_OTHER): Payer: PPO | Admitting: Internal Medicine

## 2016-02-11 ENCOUNTER — Encounter (INDEPENDENT_AMBULATORY_CARE_PROVIDER_SITE_OTHER): Payer: Self-pay

## 2016-02-11 ENCOUNTER — Encounter (INDEPENDENT_AMBULATORY_CARE_PROVIDER_SITE_OTHER): Payer: Self-pay | Admitting: Internal Medicine

## 2016-02-11 ENCOUNTER — Telehealth: Payer: Self-pay

## 2016-02-11 VITALS — BP 140/90 | HR 72 | Temp 98.1°F | Resp 18 | Ht 67.0 in | Wt 150.3 lb

## 2016-02-11 DIAGNOSIS — R634 Abnormal weight loss: Secondary | ICD-10-CM

## 2016-02-11 DIAGNOSIS — K146 Glossodynia: Secondary | ICD-10-CM

## 2016-02-11 DIAGNOSIS — K219 Gastro-esophageal reflux disease without esophagitis: Secondary | ICD-10-CM

## 2016-02-11 MED ORDER — PANTOPRAZOLE SODIUM 40 MG PO TBEC: 40.0000 mg | DELAYED_RELEASE_TABLET | Freq: Two times a day (BID) | ORAL | 5 refills | Status: DC

## 2016-02-11 NOTE — Telephone Encounter (Signed)
Left message for pt to call back for U/S appt date/time

## 2016-02-11 NOTE — Patient Instructions (Signed)
Solid-phase gastric emptying study be scheduled. Will arrange for consultation with Dr.Teoh

## 2016-02-11 NOTE — Progress Notes (Signed)
Presenting complaint;  Follow-up for GERD. Patient complains of mouth burning.  Subjective:  Teresa [name] is 65 year old Caucasian female who is here for scheduled visit accompanied by her husband. She was last seen in January 2017. She does not feel well. She states she is on limited foods. She develops a burning involving her mouth and tongue with certain foods such as salty foods cold foods and sweets. She also cannot eat foods which are spicy. She seemed to do okay with serial basic potato and salad without dressing. She also complains of dry mouth. She's had few episodes of nocturnal regurgitation. Her husband states she had an episode about 2 weeks ago and it took her breath away. She denies dysphagia. She also denies nausea or vomiting. She states she has good appetite but does not eat well because of her symptoms. Her bowels move daily. She denies melena or rectal bleeding. She has lost another 6 pounds since her last visit.    Current Medications: Outpatient Encounter Prescriptions as of 02/11/2016  Medication Sig  . buPROPion (WELLBUTRIN SR) 150 MG 12 hr tablet Take 150 mg by mouth 2 (two) times daily.    Marland Kitchen CALCIUM-MAGNESIUM-VITAMIN D PO Take by mouth daily.  Marland Kitchen CINNAMON PO Take 650 mg by mouth. [place] Cinnamon  . DULoxetine (CYMBALTA) 60 MG capsule Take 60 mg by mouth every evening.   . Ginger, Zingiber officinalis, (GINGER PO) Take by mouth.  . methylphenidate (RITALIN) 20 MG tablet Take 20 mg by mouth 2 (two) times daily.    . Multiple Vitamins-Minerals (MULTIVITAMINS THER. W/MINERALS) TABS Take 1 tablet by mouth every morning.   . Omega-3 Fatty Acids (FISH OIL PO) Take 1,600 mg by mouth.   Marland Kitchen OVER THE COUNTER MEDICATION Cumin 500 mg qd  . pantoprazole (PROTONIX) 40 MG tablet Take 1 tablet (40 mg total) by mouth daily.  . pravastatin (PRAVACHOL) 20 MG tablet Take 40 mg by mouth every evening.   . temazepam (RESTORIL) 15 MG capsule Take 15 mg by mouth at bedtime.  . [DISCONTINUED]  Vitamin D, Ergocalciferol, (DRISDOL) 50000 units CAPS capsule Take 50,000 Units by mouth every 7 (seven) days.   Facility-Administered Encounter Medications as of 02/11/2016  Medication  . diclofenac sodium (VOLTAREN) 1 % transdermal gel 2 g     Objective: Blood pressure 140/90, pulse 72, temperature 98.1 F (36.7 C), temperature source Oral, resp. rate 18, height 5\' 7"  (1.702 m), weight 150 lb 4.8 oz (68.2 kg). Patient is alert and in no acute distress. Conjunctiva is pink. Sclera is nonicteric Oropharyngeal mucosa is normal. No neck masses or thyromegaly noted. Cardiac exam with regular rhythm normal S1 and S2. No murmur or gallop noted. Lungs are clear to auscultation. Abdomen is symmetrical. Bowel sounds are normal. On palpation abdomen is soft and nontender without organomegaly or masses.  No LE edema or clubbing noted.   Assessment:  #1. GERD. Typical and atypical symptoms are not well controlled with therapy. Last EGD was in December 2012 revealing mild changes of esophagitis limited to GE junction. Need to rule out underlying gastroparesis. If gastric emptying study is abnormal she will need an EGD since last exam was almost 5 years ago. #2. Burning mouth syndrome. These symptoms may or may not be related to GERD. Will request ENT evaluation. He certainly could have glossopharyngeal neuropathy. #3. Weight loss. Weight loss appears to be due to diminished intake perhaps due to her GERD symptoms.   Plan:  Decrease pantoprazole to 40 mg by mouth twice a  day. Solid-phase gastric emptying study. ENT consultation with Dr. Benjamine Mola. Office visit in 2 months.

## 2016-02-12 ENCOUNTER — Other Ambulatory Visit (INDEPENDENT_AMBULATORY_CARE_PROVIDER_SITE_OTHER): Payer: Self-pay | Admitting: Internal Medicine

## 2016-02-12 ENCOUNTER — Other Ambulatory Visit: Payer: Self-pay | Admitting: "Endocrinology

## 2016-02-12 DIAGNOSIS — E042 Nontoxic multinodular goiter: Secondary | ICD-10-CM | POA: Diagnosis not present

## 2016-02-12 DIAGNOSIS — M9902 Segmental and somatic dysfunction of thoracic region: Secondary | ICD-10-CM | POA: Diagnosis not present

## 2016-02-12 DIAGNOSIS — M5442 Lumbago with sciatica, left side: Secondary | ICD-10-CM | POA: Diagnosis not present

## 2016-02-12 DIAGNOSIS — M9903 Segmental and somatic dysfunction of lumbar region: Secondary | ICD-10-CM | POA: Diagnosis not present

## 2016-02-12 DIAGNOSIS — M9905 Segmental and somatic dysfunction of pelvic region: Secondary | ICD-10-CM | POA: Diagnosis not present

## 2016-02-12 DIAGNOSIS — K219 Gastro-esophageal reflux disease without esophagitis: Secondary | ICD-10-CM

## 2016-02-13 ENCOUNTER — Ambulatory Visit (INDEPENDENT_AMBULATORY_CARE_PROVIDER_SITE_OTHER): Payer: PPO | Admitting: Otolaryngology

## 2016-02-13 DIAGNOSIS — K122 Cellulitis and abscess of mouth: Secondary | ICD-10-CM

## 2016-02-13 DIAGNOSIS — R1312 Dysphagia, oropharyngeal phase: Secondary | ICD-10-CM | POA: Diagnosis not present

## 2016-02-13 LAB — THYROID PEROXIDASE ANTIBODY: THYROID PEROXIDASE ANTIBODY: 23 [IU]/mL (ref 0–34)

## 2016-02-13 LAB — THYROGLOBULIN ANTIBODY: Thyroglobulin Antibody: <1 IU/mL (ref 0.0–0.9)

## 2016-02-13 LAB — T4, FREE: FREE T4: 1.44 ng/dL (ref 0.82–1.77)

## 2016-02-13 LAB — TSH: TSH: 0.618 u[IU]/mL (ref 0.450–4.500)

## 2016-02-14 DIAGNOSIS — M9902 Segmental and somatic dysfunction of thoracic region: Secondary | ICD-10-CM | POA: Diagnosis not present

## 2016-02-14 DIAGNOSIS — M9903 Segmental and somatic dysfunction of lumbar region: Secondary | ICD-10-CM | POA: Diagnosis not present

## 2016-02-14 DIAGNOSIS — M9905 Segmental and somatic dysfunction of pelvic region: Secondary | ICD-10-CM | POA: Diagnosis not present

## 2016-02-14 DIAGNOSIS — M5442 Lumbago with sciatica, left side: Secondary | ICD-10-CM | POA: Diagnosis not present

## 2016-02-20 ENCOUNTER — Encounter (HOSPITAL_COMMUNITY): Payer: PPO

## 2016-02-20 ENCOUNTER — Ambulatory Visit (HOSPITAL_COMMUNITY)
Admission: RE | Admit: 2016-02-20 | Discharge: 2016-02-20 | Disposition: A | Discharge status: Home / Self Care | Payer: PPO | Source: Ambulatory Visit | Attending: "Endocrinology | Admitting: "Endocrinology

## 2016-02-20 DIAGNOSIS — E049 Nontoxic goiter, unspecified: Secondary | ICD-10-CM | POA: Diagnosis not present

## 2016-02-20 DIAGNOSIS — E042 Nontoxic multinodular goiter: Secondary | ICD-10-CM | POA: Insufficient documentation

## 2016-02-21 DIAGNOSIS — M25519 Pain in unspecified shoulder: Secondary | ICD-10-CM | POA: Diagnosis not present

## 2016-02-21 DIAGNOSIS — M542 Cervicalgia: Secondary | ICD-10-CM | POA: Diagnosis not present

## 2016-02-21 DIAGNOSIS — Z23 Encounter for immunization: Secondary | ICD-10-CM | POA: Diagnosis not present

## 2016-02-21 DIAGNOSIS — K210 Gastro-esophageal reflux disease with esophagitis: Secondary | ICD-10-CM | POA: Diagnosis not present

## 2016-02-21 DIAGNOSIS — M545 Low back pain: Secondary | ICD-10-CM | POA: Diagnosis not present

## 2016-02-24 ENCOUNTER — Ambulatory Visit (INDEPENDENT_AMBULATORY_CARE_PROVIDER_SITE_OTHER): Payer: PPO | Admitting: "Endocrinology

## 2016-02-24 ENCOUNTER — Encounter: Payer: Self-pay | Admitting: "Endocrinology

## 2016-02-24 VITALS — BP 135/74 | HR 81 | Ht 67.0 in | Wt 151.0 lb

## 2016-02-24 DIAGNOSIS — M545 Low back pain: Secondary | ICD-10-CM | POA: Diagnosis not present

## 2016-02-24 DIAGNOSIS — E042 Nontoxic multinodular goiter: Secondary | ICD-10-CM

## 2016-02-24 DIAGNOSIS — M542 Cervicalgia: Secondary | ICD-10-CM | POA: Diagnosis not present

## 2016-02-24 DIAGNOSIS — K210 Gastro-esophageal reflux disease with esophagitis: Secondary | ICD-10-CM | POA: Diagnosis not present

## 2016-02-24 DIAGNOSIS — M25519 Pain in unspecified shoulder: Secondary | ICD-10-CM | POA: Diagnosis not present

## 2016-02-24 NOTE — Progress Notes (Signed)
Subjective:    Patient ID: Teresa [name], female    DOB: Jul 21, 1950, PCP Alonza Bogus, MD   Past Medical History:  Diagnosis Date  . Arthritis   . Depression   . Disc disorder   . Fibromyalgia   . GERD (gastroesophageal reflux disease)   . High cholesterol   . Insomnia    Past Surgical History:  Procedure Laterality Date  . BUNIONECTOMY    . FACIAL COSMETIC SURGERY    . TONSILLECTOMY     Social History   Social History  . Marital status: Married    Spouse name: N/A  . Number of children: N/A  . Years of education: N/A   Social History Main Topics  . Smoking status: Never Smoker  . Smokeless tobacco: Never Used  . Alcohol use No  . Drug use: No  . Sexual activity: Not Asked   Other Topics Concern  . None   Social History Narrative  . None   Outpatient Encounter Prescriptions as of 02/24/2016  Medication Sig  . buPROPion (WELLBUTRIN SR) 150 MG 12 hr tablet Take 150 mg by mouth 2 (two) times daily.    Marland Kitchen CALCIUM-MAGNESIUM-VITAMIN D PO Take by mouth daily.  Marland Kitchen CINNAMON PO Take 650 mg by mouth. [place] Cinnamon  . DULoxetine (CYMBALTA) 60 MG capsule Take 60 mg by mouth every evening.   . Ginger, Zingiber officinalis, (GINGER PO) Take by mouth.  . methylphenidate (RITALIN) 20 MG tablet Take 20 mg by mouth 2 (two) times daily.    . Multiple Vitamins-Minerals (MULTIVITAMINS THER. W/MINERALS) TABS Take 1 tablet by mouth every morning.   . Omega-3 Fatty Acids (FISH OIL PO) Take 1,600 mg by mouth.   Marland Kitchen OVER THE COUNTER MEDICATION Cumin 500 mg qd  . pantoprazole (PROTONIX) 40 MG tablet Take 1 tablet (40 mg total) by mouth 2 (two) times daily before a meal.  . pravastatin (PRAVACHOL) 20 MG tablet Take 40 mg by mouth every evening.   . temazepam (RESTORIL) 15 MG capsule Take 15 mg by mouth at bedtime.   Facility-Administered Encounter Medications as of 02/24/2016  Medication  . diclofenac sodium (VOLTAREN) 1 % transdermal gel 2 g   ALLERGIES: Allergies  Allergen  Reactions  . Meat Extract Anaphylaxis    Any meat at all BITTEN BY TICK-DEVELOPED MAMMAL MEAT ALLERGY   . Lactose Intolerance (Gi)   . Strawberry Extract Other (See Comments)    Possible allergic reaction, patient is unsure  . Latex Rash   VACCINATION STATUS:  There is no immunization history on file for this patient.  HPI  65 year old female patient with medical history as above. She is being seen in f/u for history of multinodular goiter  With repeat thyroid sonograms and thyroid function tests. She has no new complaints.  -  her records she was known to have multinodular goiter from 2009 which did not require any intervention. In 2010, when she had her last ultrasound the right lobe was 4.6 cm with a 9 mm nodule which was reported to be stable and left lobe 4.7 cm with stable 24mm cm nodule.  - Her repeat surveillance thyroid sonogram on 02/20/2016 showed right lobe 4.1 cm with 1 cm nodule (previously 0.9 cm) and left lobe 4.6 cm with 0.9 cm nodule (previously 0.6 cm).  She denies dysphagia, shortness of breath, change in her voice. She denies any exposure to neck radiation. She has family history of benign multinodular goiter in one of her grandparents. -  Her repeat thyroid function tests are within normal limits. She denies  palpitations, tremors, heat intolerance/ hot Flushes. - She has steady body weight after she lost approximately 10 pounds over one year.  Review of Systems Constitutional: +weight loss, + fatigue, no subjective hyperthermia Eyes: no blurry vision, no xerophthalmia ENT: no sore throat, no nodules palpated in throat, no dysphagia/odynophagia, no hoarseness Cardiovascular: no CP/SOB, +palpitations, leg swelling Respiratory: no cough/SOB Gastrointestinal: no N/V/D/C Musculoskeletal: no muscle/joint aches Skin: no rashes Neurological: + tremors, -numbness/tingling/dizziness Psychiatric: no depression/anxiety  Objective:    BP 135/74   Pulse 81   Ht 5\' 7"   (1.702 m)   Wt 151 lb (68.5 kg)   BMI 23.65 kg/m   Wt Readings from Last 3 Encounters:  02/24/16 151 lb (68.5 kg)  02/11/16 150 lb 4.8 oz (68.2 kg)  02/10/16 147 lb (66.7 kg)    Physical Exam Constitutional:  in NAD Eyes: PERRLA, EOMI, no exophthalmos ENT: moist mucous membranes, no thyromegaly, no cervical lymphadenopathy Cardiovascular: RRR, No MRG Respiratory: CTA B Gastrointestinal: abdomen soft, NT, ND, BS+ Musculoskeletal: no deformities, strength intact in all 4 Skin: moist, warm, no rashes Neurological: + tremor with outstretched hands, DTR normal in all 4  Recent Results (from the past 2160 hour(s))  T4, free     Status: None   Collection Time: 02/12/16 12:21 PM  Result Value Ref Range   Free T4 1.44 0.82 - 1.77 ng/dL  TSH     Status: None   Collection Time: 02/12/16 12:21 PM  Result Value Ref Range   TSH 0.618 0.450 - 4.500 uIU/mL  Thyroglobulin antibody     Status: None   Collection Time: 02/12/16 12:21 PM  Result Value Ref Range   Thyroglobulin Antibody <1.0 0.0 - 0.9 IU/mL    Comment: Thyroglobulin Antibody measured by Beckman Coulter Methodology  Thyroid peroxidase antibody     Status: None   Collection Time: 02/12/16 12:21 PM  Result Value Ref Range   Thyroperoxidase Ab SerPl-aCnc 23 0 - 34 IU/mL    2010 thyroid ultrasound showed right lobe 4.6 cm with a 9 mm nodule and left lobe 4.7 cm with 9 mm stable nodules.  - Her repeat surveillance thyroid sonograms on 02/20/2016 showed right lobe 4.1C meter with 1 cm nodule (previously 0.9 cm) and left lobe 4.6 cm width 0.9 cm nodule (previously 0.6 cm). Assessment & Plan:   1. Multinodular goiter - She has euthyroid multinodular goiter with minimal / unremarkable nodular size change over the last 7 years. The largest nodule on the right lobe is 1 cm with no suspicious features. She will not need fine needle aspiration at this time. The latest sonogram findings where discussed with her. She would not need any  intervention at this time. She would need thyroid function test in 1 year with office visit. She may need repeat thyroid ultrasound in 1-2 years to follow-up.  - I advised patient to maintain close follow up with HAWKINS,EDWARD L, MD for primary care needs. Follow up plan: Return in about 1 year (around 02/23/2017).  Glade Lloyd, MD Phone: [phone]  Fax: [phone]   02/24/2016, 1:44 PM

## 2016-02-29 ENCOUNTER — Other Ambulatory Visit (HOSPITAL_COMMUNITY): Payer: Self-pay | Admitting: Pulmonary Disease

## 2016-02-29 ENCOUNTER — Ambulatory Visit (HOSPITAL_COMMUNITY)
Admission: RE | Admit: 2016-02-29 | Discharge: 2016-02-29 | Disposition: A | Discharge status: Home / Self Care | Payer: PPO | Source: Ambulatory Visit | Attending: Pulmonary Disease | Admitting: Pulmonary Disease

## 2016-02-29 DIAGNOSIS — M50322 Other cervical disc degeneration at C5-C6 level: Secondary | ICD-10-CM | POA: Diagnosis not present

## 2016-02-29 DIAGNOSIS — M5136 Other intervertebral disc degeneration, lumbar region: Secondary | ICD-10-CM | POA: Insufficient documentation

## 2016-02-29 DIAGNOSIS — M542 Cervicalgia: Secondary | ICD-10-CM

## 2016-02-29 DIAGNOSIS — M546 Pain in thoracic spine: Secondary | ICD-10-CM

## 2016-02-29 DIAGNOSIS — M545 Low back pain: Secondary | ICD-10-CM

## 2016-04-02 ENCOUNTER — Other Ambulatory Visit (HOSPITAL_COMMUNITY): Payer: Self-pay | Admitting: Pulmonary Disease

## 2016-04-02 DIAGNOSIS — M545 Low back pain: Secondary | ICD-10-CM

## 2016-04-02 DIAGNOSIS — M5030 Other cervical disc degeneration, unspecified cervical region: Secondary | ICD-10-CM

## 2016-04-08 ENCOUNTER — Ambulatory Visit (HOSPITAL_COMMUNITY): Payer: PPO

## 2016-04-22 ENCOUNTER — Ambulatory Visit (HOSPITAL_COMMUNITY)
Admission: RE | Admit: 2016-04-22 | Discharge: 2016-04-22 | Disposition: A | Discharge status: Home / Self Care | Payer: PPO | Source: Ambulatory Visit | Attending: Pulmonary Disease | Admitting: Pulmonary Disease

## 2016-04-22 DIAGNOSIS — M47816 Spondylosis without myelopathy or radiculopathy, lumbar region: Secondary | ICD-10-CM | POA: Insufficient documentation

## 2016-04-22 DIAGNOSIS — M545 Low back pain: Secondary | ICD-10-CM | POA: Diagnosis not present

## 2016-04-22 DIAGNOSIS — M5137 Other intervertebral disc degeneration, lumbosacral region: Secondary | ICD-10-CM | POA: Diagnosis not present

## 2016-04-22 DIAGNOSIS — M50322 Other cervical disc degeneration at C5-C6 level: Secondary | ICD-10-CM | POA: Insufficient documentation

## 2016-04-22 DIAGNOSIS — M47892 Other spondylosis, cervical region: Secondary | ICD-10-CM | POA: Insufficient documentation

## 2016-04-22 DIAGNOSIS — G9619 Other disorders of meninges, not elsewhere classified: Secondary | ICD-10-CM | POA: Diagnosis not present

## 2016-04-22 DIAGNOSIS — M5030 Other cervical disc degeneration, unspecified cervical region: Secondary | ICD-10-CM

## 2016-05-12 ENCOUNTER — Ambulatory Visit (INDEPENDENT_AMBULATORY_CARE_PROVIDER_SITE_OTHER): Payer: PPO | Admitting: Physical Medicine and Rehabilitation

## 2016-05-19 ENCOUNTER — Ambulatory Visit (INDEPENDENT_AMBULATORY_CARE_PROVIDER_SITE_OTHER): Payer: PPO | Admitting: Physical Medicine and Rehabilitation

## 2016-05-19 ENCOUNTER — Ambulatory Visit (INDEPENDENT_AMBULATORY_CARE_PROVIDER_SITE_OTHER): Payer: PPO | Admitting: Internal Medicine

## 2016-05-25 DIAGNOSIS — M1811 Unilateral primary osteoarthritis of first carpometacarpal joint, right hand: Secondary | ICD-10-CM | POA: Diagnosis not present

## 2016-05-28 ENCOUNTER — Encounter (INDEPENDENT_AMBULATORY_CARE_PROVIDER_SITE_OTHER): Payer: Self-pay

## 2016-05-28 ENCOUNTER — Ambulatory Visit (INDEPENDENT_AMBULATORY_CARE_PROVIDER_SITE_OTHER): Payer: PPO | Admitting: Physical Medicine and Rehabilitation

## 2016-05-28 ENCOUNTER — Telehealth (INDEPENDENT_AMBULATORY_CARE_PROVIDER_SITE_OTHER): Payer: Self-pay | Admitting: Physical Medicine and Rehabilitation

## 2016-05-28 ENCOUNTER — Encounter (INDEPENDENT_AMBULATORY_CARE_PROVIDER_SITE_OTHER): Payer: Self-pay | Admitting: Physical Medicine and Rehabilitation

## 2016-05-28 VITALS — BP 141/70

## 2016-05-28 DIAGNOSIS — M47816 Spondylosis without myelopathy or radiculopathy, lumbar region: Secondary | ICD-10-CM | POA: Diagnosis not present

## 2016-05-28 DIAGNOSIS — M545 Low back pain: Secondary | ICD-10-CM

## 2016-05-28 DIAGNOSIS — M419 Scoliosis, unspecified: Secondary | ICD-10-CM

## 2016-05-28 DIAGNOSIS — G8929 Other chronic pain: Secondary | ICD-10-CM

## 2016-05-28 NOTE — Progress Notes (Signed)
Teresa [name] - 66 y.o. female MRN [id number]  Date of birth: 02/24/1951  Office Visit Note: Visit Date: 05/28/2016 PCP: Alonza Bogus, MD Referred by: Sinda Du, MD  Subjective: Chief Complaint  Patient presents with  . Lower Back - Pain   HPI: Teresa [name] is a 66 year old female with chronic worsening severe lower back pain for several years and states she has had no treatment for this. She reports significant worsening on the right side lower back and into buttock for a couple of weeks. No injury. Denies pain radiating down leg. States pain across back is constant. Sharp like pain at times on right side with certain movements. Denies numbness and tingling. She reports pain with extension standing and rotation. She reports pain rotating in bed. She denies any focal weakness or focal night time pain per se. She's had no fevers, chills or night sweats. No unexplained weight loss. Her case is complicated by fibromyalgia. She has seen Dr. Arther Abbott in orthopedics for some orthopedic complaints of shoulder pain etc. but has been followed from a low back perspective by her primary care physician Dr. Luan Pulling. Dr. Maryjean Ka did obtain lumbar and cervical spine MRIs and these are reviewed below. She also has neck and shoulder pain as well. She states that she was referred to a neurosurgeon that she has an appointment for in a few weeks. This is a Dr. [facility]. I did briefly look at the cervical spine MRI and reviewed it with her. There is some moderate central canal stenosis at C5-C6.  She has not had recent physical therapy but is been through therapy in the past. She has not had chiropractic care is not something she necessarily believes him. She does try to stay active. Sleeping is difficult. She is on several psychotropic medications including Wellbutrin, Cymbalta, Ritalin, temazepam. The Cymbalta and temazepam may be beneficial from a pain  standpoint although obviously is not helping at this point.    Review of Systems  Constitutional: Positive for malaise/fatigue. Negative for chills, fever and weight loss.  HENT: Negative for hearing loss and sinus pain.   Eyes: Negative for blurred vision, double vision and photophobia.  Respiratory: Negative for cough and shortness of breath.   Cardiovascular: Negative for chest pain, palpitations and leg swelling.  Gastrointestinal: Negative for abdominal pain, nausea and vomiting.  Genitourinary: Negative for flank pain.  Musculoskeletal: Positive for back pain, joint pain and neck pain. Negative for myalgias.  Skin: Negative for itching and rash.  Neurological: Negative for tremors, focal weakness and weakness.  Endo/Heme/Allergies: Negative.   Psychiatric/Behavioral: Negative for depression.  All other systems reviewed and are negative.  Otherwise per HPI.  Assessment & Plan: Visit Diagnoses:  1. Chronic right-sided low back pain without sciatica   2. Spondylosis without myelopathy or radiculopathy, lumbar region   3. Scoliosis of thoracolumbar spine, unspecified scoliosis type     Plan: Findings:  Chronic worsening severe axial low back pain with some referral into the buttock but nothing down the legs and this is mostly on the right. It is worse with movement and extension in standing. It seems to be fairly classic facet joint mediated pain. She does have some central canal stenosis in the upper lumbar spine obviously this could cause a buttock type pain. I think her symptoms are still consistent more with facet mediated pain. The next step would be diagnostic blocks of the lower facet joints to see if she gets relief.  This would either be done on the right or bilaterally depending on her symptoms. If she did well with that we would regroup with physical therapy somewhere in [place]. Depending on the outcome of that we would look at radiofrequency ablation as a possible  longer-term treatment. She is following up with a consultation with Dr. Cyndy Freeze at [facility]. He will likely weigh in on a treatment plan as well. He may be looking more to her cervical spine which does show significant stenosis centrally at C5-6. Obviously from that standpoint she is at higher risk for central cord syndrome with trauma. I would not change any medications at this point. She is on some psychotropic medications for her other conditions and it would be hard to add anything to this. What I can tell on physical exam findings and symptoms she is not a candidate for chronic opioid therapy. I spent more than 30 minutes speaking face-to-face with the patient with 50% of the time in counseling. We will need to get approval through [employer]. Depending on timing we may get a retroactive approval.    Meds & Orders: No orders of the defined types were placed in this encounter.  No orders of the defined types were placed in this encounter.   Follow-up: Return for Facet joint blocks on the right at L4-5 and L5-S1 once approval is obtained..   Procedures: No procedures performed  No notes on file   Clinical History: Lumbar spine MRI 04/22/2016  L2-3: Moderate central narrowing of the thecal sac with mild bilateral foraminal stenosis, mild displacement of the right L2 nerve in the lateral extraforaminal space, and mild bilateral subarticular lateral recess stenosis due to disc bulge, left lateral recess and foraminal disc protrusion, and facet arthropathy.   L3-4: Mild bilateral foraminal stenosis with mild bilateral subarticular lateral recess stenosis and mild central narrowing of the thecal sac due to disc bulge, facet arthropathy, and intervertebral spurring.   L4-5: Mild right foraminal stenosis with bilateral subarticular lateral recess stenosis due to disc bulge and facet arthropathy as well as right foraminal intervertebral spurring.     L5-S1: Moderate right foraminal stenosis with borderline bilateral subarticular lateral recess stenosis due to disc bulge and right greater than left facet arthropathy.   IMPRESSION: 1. Lumbar spondylosis and degenerative disc disease, resulting in moderate impingement at L2- 3 and L5-S1; and mild impingement at L1- 2, L3-4, and L4-5, as detailed above.  She reports that she has never smoked. She has never used smokeless tobacco. No results for input(s): HGBA1C, LABURIC in the last 8760 hours.  Objective:  VS:  HT:    WT:   BMI:     BP:(!) 141/70  HR: bpm  TEMP: ( )  RESP:  Physical Exam  Constitutional: She is oriented to person, place, and time. She appears well-developed and well-nourished. No distress.  HENT:  Head: Normocephalic and atraumatic.  Nose: Nose normal.  Mouth/Throat: Oropharynx is clear and moist.  Eyes: Conjunctivae are normal. Pupils are equal, round, and reactive to light.  Neck: Normal range of motion. Neck supple.  Cardiovascular: Regular rhythm and intact distal pulses.   Pulmonary/Chest: Effort normal and breath sounds normal.  Abdominal: Soft. She exhibits no distension.  Musculoskeletal:  Patient ambulates without aid. She stands from a seated position with some difficulty but mild. She does have concordant pain with extension rotation to the right. She does have tender spots along the paraspinal musculatures PSIS and greater trochanters. She  has no focal pain over the greater trochanters. She has good distal strength without clonus.  Neurological: She is alert and oriented to person, place, and time. She displays abnormal reflex. She exhibits abnormal muscle tone. Coordination abnormal.  Skin: Skin is warm. No rash noted. No erythema.  Psychiatric: She has a normal mood and affect. Her behavior is normal.  Nursing note and vitals reviewed.   Ortho Exam Imaging: No results found.  Past Medical/Family/Surgical/Social History: Medications & Allergies  reviewed per EMR Patient Active Problem List   Diagnosis Date Noted  . Multinodular goiter 02/10/2016  . Shoulder impingement syndrome 02/26/2014  . Nevus, non-neoplastic 07/01/2011  . GERD (gastroesophageal reflux disease) 05/21/2011  . RUPTURE ROTATOR CUFF 07/18/2009  . SHOULDER PAIN 07/01/2009  . IMPINGEMENT SYNDROME 07/01/2009  . SUPERIOR GLENOID LABRUM TEAR 07/01/2009  . HYPERLIPIDEMIA 05/31/2009  . TMJ PAIN 05/31/2009  . FIBROMYALGIA 05/31/2009  . FATIGUE 05/31/2009  . TACHYCARDIA 05/31/2009  . DYSPNEA 05/31/2009  . Chest pain, unspecified 05/31/2009   Past Medical History:  Diagnosis Date  . Arthritis   . Depression   . Disc disorder   . Fibromyalgia   . GERD (gastroesophageal reflux disease)   . High cholesterol   . Insomnia    Family History  Problem Relation Age of Onset  . Diabetes Mother   . Stroke Mother   . Kidney disease Mother   . Depression Mother   . Heart disease Mother   . Hypertension Mother   . Clotting disorder Mother   . Arthritis Mother   . Osteoporosis Mother   . Ulcers Father   . GER disease Father   . Alcohol abuse Father   . Diabetes Sister   . Hypertension Sister   . Arthritis Sister   . Asthma Maternal Grandmother   . Colon cancer Neg Hx    Past Surgical History:  Procedure Laterality Date  . BUNIONECTOMY    . FACIAL COSMETIC SURGERY    . TONSILLECTOMY     Social History   Occupational History  . Not on file.   Social History Main Topics  . Smoking status: Never Smoker  . Smokeless tobacco: Never Used  . Alcohol use No  . Drug use: No  . Sexual activity: Not on file

## 2016-06-01 NOTE — Telephone Encounter (Signed)
Faxed auth form to [employer]. Wrote priority processing on top of form.

## 2016-06-02 NOTE — Telephone Encounter (Signed)
Received auth from [employer]. [id number]. Eff 06/01/16-08/30/16. Called pt and scheduled her for 06/05/16 @ 11:30 w/driver and no BTS.

## 2016-06-05 ENCOUNTER — Encounter (INDEPENDENT_AMBULATORY_CARE_PROVIDER_SITE_OTHER): Payer: PPO | Admitting: Physical Medicine and Rehabilitation

## 2016-06-16 ENCOUNTER — Ambulatory Visit (INDEPENDENT_AMBULATORY_CARE_PROVIDER_SITE_OTHER): Payer: Self-pay

## 2016-06-16 ENCOUNTER — Ambulatory Visit (INDEPENDENT_AMBULATORY_CARE_PROVIDER_SITE_OTHER): Payer: PPO | Admitting: Physical Medicine and Rehabilitation

## 2016-06-16 ENCOUNTER — Encounter (INDEPENDENT_AMBULATORY_CARE_PROVIDER_SITE_OTHER): Payer: Self-pay | Admitting: Physical Medicine and Rehabilitation

## 2016-06-16 VITALS — BP 123/55 | HR 82

## 2016-06-16 DIAGNOSIS — M47816 Spondylosis without myelopathy or radiculopathy, lumbar region: Secondary | ICD-10-CM

## 2016-06-16 MED ORDER — METHYLPREDNISOLONE ACETATE 80 MG/ML IJ SUSP
80.0000 mg | Freq: Once | INTRAMUSCULAR | Status: AC
  Administered 2016-06-16: 80 mg

## 2016-06-16 MED ORDER — LIDOCAINE HCL (PF) 1 % IJ SOLN
0.3300 mL | Freq: Once | INTRAMUSCULAR | Status: AC
  Administered 2016-06-16: 0.3 mL

## 2016-06-16 NOTE — Procedures (Signed)
Lumbar Facet Joint Intra-Articular Injection(s) with Fluoroscopic Guidance  Patient: Teresa [name]      Date of Birth: [birth year] MRN: [id number] PCP: Alonza Bogus, MD      Visit Date: 06/16/2016   Universal Protocol:    Date/Time: 03/06/181:14 PM  Consent Given By: the patient  Position: PRONE   Additional Comments: Vital signs were monitored before and after the procedure. Patient was prepped and draped in the usual sterile fashion. The correct patient, procedure, and site was verified.   Injection Procedure Details:  Procedure Site One Meds Administered:  Meds ordered this encounter  Medications  . lidocaine (PF) (XYLOCAINE) 1 % injection 0.3 mL  . methylPREDNISolone acetate (DEPO-MEDROL) injection 80 mg     Laterality: Right  Location/Site:  L4-L5 L5-S1  Needle size: 22 guage  Needle type: Spinal  Needle Placement: Articular  Findings:  -Contrast Used: 1 mL iohexol 180 mg iodine/mL   -Comments: Excellent flow of contrast producing a partial arthrogram.  Procedure Details: The fluoroscope beam is vertically oriented in AP, and the inferior recess is visualized beneath the lower pole of the inferior apophyseal process, which represents the target point for needle insertion. When direct visualization is difficult the target point is located at the medial projection of the vertebral pedicle. The region overlying each aforementioned target is locally anesthetized with a 1 to 2 ml. volume of 1% Lidocaine without Epinephrine.   The spinal needle was inserted into each of the above mentioned facet joints using biplanar fluoroscopic guidance. A 0.25 to 0.5 ml. volume of Isovue-250 was injected and a partial facet joint arthrogram was obtained. A single spot film was obtained of the resulting arthrogram.    One to 1.25 ml of the steroid/anesthetic solution was then injected into each of the facet joints noted above.   Additional Comments:  The patient tolerated the  procedure well Dressing: Band-Aid    Post-procedure details: Patient was observed during the procedure. Post-procedure instructions were reviewed.  Patient left the clinic in stable condition.

## 2016-06-16 NOTE — Patient Instructions (Signed)

## 2016-06-16 NOTE — Progress Notes (Signed)
Teresa [name] - 66 y.o. female MRN [id number]  Date of birth: 02-08-1951  Office Visit Note: Visit Date: 06/16/2016 PCP: Alonza Bogus, MD Referred by: Sinda Du, MD  Subjective: Chief Complaint  Patient presents with  . Lower Back - Pain   HPI: Teresa [name] is a very pleasant but very anxious 66 year old female here today for planned right L4-5 and L5-S1 facet injection. No change in symptoms. We saw her recently for evaluation and management and I would direct you to those notes for further justification and details. He did have to delay the injection due to the fluoroscopic unit being down for repair. She also asked today about anyone in the office looking in hand arthritis particularly in her thumb and CMC joint. I directed her to Dr. Marlou Sa or Dr. Erlinda Hong. Would be happy to make that appointment should she choose to do that.    ROS Otherwise per HPI.  Assessment & Plan: Visit Diagnoses:  1. Spondylosis without myelopathy or radiculopathy, lumbar region     Plan: Findings:  Right L4-5 and L5-S1 facet joint block with fluoroscopic guidance.    Meds & Orders:  Meds ordered this encounter  Medications  . lidocaine (PF) (XYLOCAINE) 1 % injection 0.3 mL  . methylPREDNISolone acetate (DEPO-MEDROL) injection 80 mg    Orders Placed This Encounter  Procedures  . Facet Injection  . XR C-ARM NO REPORT    Follow-up: Return in about 3 weeks (around 07/07/2016).   Procedures: No procedures performed  Lumbar Facet Joint Intra-Articular Injection(s) with Fluoroscopic Guidance  Patient: Teresa [name]      Date of Birth: 1950/10/10 MRN: [id number] PCP: Alonza Bogus, MD      Visit Date: 06/16/2016   Universal Protocol:    Date/Time: 03/06/181:14 PM  Consent Given By: the patient  Position: PRONE   Additional Comments: Vital signs were monitored before and after the procedure. Patient was prepped and draped in the usual sterile fashion. The correct patient, procedure,  and site was verified.   Injection Procedure Details:  Procedure Site One Meds Administered:  Meds ordered this encounter  Medications  . lidocaine (PF) (XYLOCAINE) 1 % injection 0.3 mL  . methylPREDNISolone acetate (DEPO-MEDROL) injection 80 mg     Laterality: Right  Location/Site:  L4-L5 L5-S1  Needle size: 22 guage  Needle type: Spinal  Needle Placement: Articular  Findings:  -Contrast Used: 1 mL iohexol 180 mg iodine/mL   -Comments: Excellent flow of contrast producing a partial arthrogram.  Procedure Details: The fluoroscope beam is vertically oriented in AP, and the inferior recess is visualized beneath the lower pole of the inferior apophyseal process, which represents the target point for needle insertion. When direct visualization is difficult the target point is located at the medial projection of the vertebral pedicle. The region overlying each aforementioned target is locally anesthetized with a 1 to 2 ml. volume of 1% Lidocaine without Epinephrine.   The spinal needle was inserted into each of the above mentioned facet joints using biplanar fluoroscopic guidance. A 0.25 to 0.5 ml. volume of Isovue-250 was injected and a partial facet joint arthrogram was obtained. A single spot film was obtained of the resulting arthrogram.    One to 1.25 ml of the steroid/anesthetic solution was then injected into each of the facet joints noted above.   Additional Comments:  The patient tolerated the procedure well Dressing: Band-Aid    Post-procedure details: Patient was observed during the procedure. Post-procedure instructions were reviewed.  Patient  left the clinic in stable condition.     Clinical History: Lumbar spine MRI 04/22/2016  L2-3: Moderate central narrowing of the thecal sac with mild bilateral foraminal stenosis, mild displacement of the right L2 nerve in the lateral extraforaminal space, and mild bilateral subarticular lateral recess stenosis due to  disc bulge, left lateral recess and foraminal disc protrusion, and facet arthropathy.   L3-4: Mild bilateral foraminal stenosis with mild bilateral subarticular lateral recess stenosis and mild central narrowing of the thecal sac due to disc bulge, facet arthropathy, and intervertebral spurring.   L4-5: Mild right foraminal stenosis with bilateral subarticular lateral recess stenosis due to disc bulge and facet arthropathy as well as right foraminal intervertebral spurring.   L5-S1: Moderate right foraminal stenosis with borderline bilateral subarticular lateral recess stenosis due to disc bulge and right greater than left facet arthropathy.   IMPRESSION: 1. Lumbar spondylosis and degenerative disc disease, resulting in moderate impingement at L2- 3 and L5-S1; and mild impingement at L1- 2, L3-4, and L4-5, as detailed above.  She reports that she has never smoked. She has never used smokeless tobacco. No results for input(s): HGBA1C, LABURIC in the last 8760 hours.  Objective:  VS:  HT:    WT:   BMI:     BP:(!) 123/55  HR:82bpm  TEMP: ( )  RESP:99 % Physical Exam  Musculoskeletal:  Patient ambulates without aid. She has pain concordantly on the right with extension rotation. She has good distal strength.    Ortho Exam Imaging: Xr C-arm No Report  Result Date: 06/16/2016 Please see Notes or Procedures tab for imaging impression.   Past Medical/Family/Surgical/Social History: Medications & Allergies reviewed per EMR Patient Active Problem List   Diagnosis Date Noted  . Multinodular goiter 02/10/2016  . Shoulder impingement syndrome 02/26/2014  . Nevus, non-neoplastic 07/01/2011  . GERD (gastroesophageal reflux disease) 05/21/2011  . RUPTURE ROTATOR CUFF 07/18/2009  . SHOULDER PAIN 07/01/2009  . IMPINGEMENT SYNDROME 07/01/2009  . SUPERIOR GLENOID LABRUM TEAR 07/01/2009  . HYPERLIPIDEMIA 05/31/2009  . TMJ PAIN 05/31/2009  . FIBROMYALGIA 05/31/2009  . FATIGUE  05/31/2009  . TACHYCARDIA 05/31/2009  . DYSPNEA 05/31/2009  . Chest pain, unspecified 05/31/2009   Past Medical History:  Diagnosis Date  . Arthritis   . Depression   . Disc disorder   . Fibromyalgia   . GERD (gastroesophageal reflux disease)   . High cholesterol   . Insomnia    Family History  Problem Relation Age of Onset  . Diabetes Mother   . Stroke Mother   . Kidney disease Mother   . Depression Mother   . Heart disease Mother   . Hypertension Mother   . Clotting disorder Mother   . Arthritis Mother   . Osteoporosis Mother   . Ulcers Father   . GER disease Father   . Alcohol abuse Father   . Diabetes Sister   . Hypertension Sister   . Arthritis Sister   . Asthma Maternal Grandmother   . Colon cancer Neg Hx    Past Surgical History:  Procedure Laterality Date  . BUNIONECTOMY    . FACIAL COSMETIC SURGERY    . TONSILLECTOMY     Social History   Occupational History  . Not on file.   Social History Main Topics  . Smoking status: Never Smoker  . Smokeless tobacco: Never Used  . Alcohol use No  . Drug use: No  . Sexual activity: Not on file

## 2016-06-18 DIAGNOSIS — M4727 Other spondylosis with radiculopathy, lumbosacral region: Secondary | ICD-10-CM | POA: Diagnosis not present

## 2016-06-18 DIAGNOSIS — M4722 Other spondylosis with radiculopathy, cervical region: Secondary | ICD-10-CM | POA: Diagnosis not present

## 2016-06-22 ENCOUNTER — Other Ambulatory Visit (INDEPENDENT_AMBULATORY_CARE_PROVIDER_SITE_OTHER): Payer: Self-pay | Admitting: Internal Medicine

## 2016-06-24 ENCOUNTER — Telehealth (INDEPENDENT_AMBULATORY_CARE_PROVIDER_SITE_OTHER): Payer: Self-pay

## 2016-06-24 NOTE — Telephone Encounter (Signed)
Pt had injection on 06/16/16 on the right side and has had great relief. Now having a lot of pain on the left side and wants to know if she can get an injection on that side. Has appt on 07/07/16 for a return ov and wants to have injection that day instead if possible. Has [facility] and they are aware that all procedures require precert so that's why they are going ahead and calling so we can work on it.

## 2016-06-25 NOTE — Telephone Encounter (Signed)
Faxed auth request with notes to [email or web address]

## 2016-06-25 NOTE — Telephone Encounter (Signed)
If left side is low back mostly then ok to do same on that if [facility] allows

## 2016-07-03 NOTE — Telephone Encounter (Signed)
Still pending per website 

## 2016-07-03 NOTE — Telephone Encounter (Signed)
Teresa [name] w/[facility] called to see if we submitted auth request in error as they had already approved the codes awhile back. Adv that we are requesting auth codes again because she is now having pain on the left side. Previous injection was just done on the right.

## 2016-07-07 ENCOUNTER — Ambulatory Visit (INDEPENDENT_AMBULATORY_CARE_PROVIDER_SITE_OTHER): Payer: PPO | Admitting: Physical Medicine and Rehabilitation

## 2016-07-07 ENCOUNTER — Encounter (INDEPENDENT_AMBULATORY_CARE_PROVIDER_SITE_OTHER): Payer: Self-pay

## 2016-07-07 ENCOUNTER — Ambulatory Visit (INDEPENDENT_AMBULATORY_CARE_PROVIDER_SITE_OTHER): Payer: PPO

## 2016-07-07 ENCOUNTER — Encounter (INDEPENDENT_AMBULATORY_CARE_PROVIDER_SITE_OTHER): Payer: Self-pay | Admitting: Physical Medicine and Rehabilitation

## 2016-07-07 VITALS — BP 132/64 | HR 73 | Temp 98.2°F

## 2016-07-07 DIAGNOSIS — M47816 Spondylosis without myelopathy or radiculopathy, lumbar region: Secondary | ICD-10-CM

## 2016-07-07 MED ORDER — METHYLPREDNISOLONE ACETATE 80 MG/ML IJ SUSP
80.0000 mg | Freq: Once | INTRAMUSCULAR | Status: AC
  Administered 2016-07-07: 80 mg

## 2016-07-07 MED ORDER — LIDOCAINE HCL (PF) 1 % IJ SOLN
0.3300 mL | Freq: Once | INTRAMUSCULAR | Status: AC
  Administered 2016-07-07: 0.3 mL

## 2016-07-07 NOTE — Patient Instructions (Signed)

## 2016-07-07 NOTE — Telephone Encounter (Signed)
Received auth. [id number]. eff 06/25/16-09/23/16.

## 2016-07-07 NOTE — Progress Notes (Signed)
Teresa [name] - 66 y.o. female MRN [id number]  Date of birth: May 19, 1950  Office Visit Note: Visit Date: 07/07/2016 PCP: Alonza Bogus, MD Referred by: Sinda Du, MD  Subjective: Chief Complaint  Patient presents with  . Lower Back - Pain   HPI: Teresa [name] is a 66 year old female here today for left side low back into buttock. Denies leg pain. Constant pain and sharp pains with certain movements. We recently completed right-sided facet joint blocks at L4-5 and L5-S1 with 85% relief of her symptoms on the right side. This seems to have unmasked her left-sided axial complaints and I did discuss with her the last time we saw her that we could complete this on the left side if we needed to. Her case is complicated by anxiety and fibromyalgia. We did answer questions fully today. She also a lot of questions about be seeing her husband which I be happy to do.     ROS Otherwise per HPI.  Assessment & Plan: Visit Diagnoses:  1. Spondylosis without myelopathy or radiculopathy, lumbar region     Plan: Findings:  Left L4-5 and L5-S1 facet joint blocks.    Meds & Orders:  Meds ordered this encounter  Medications  . lidocaine (PF) (XYLOCAINE) 1 % injection 0.3 mL  . methylPREDNISolone acetate (DEPO-MEDROL) injection 80 mg    Orders Placed This Encounter  Procedures  . Facet Injection  . XR C-ARM NO REPORT    Follow-up: Return if symptoms worsen or fail to improve.   Procedures: No procedures performed  Lumbar Facet Joint Intra-Articular Injection(s) with Fluoroscopic Guidance  Patient: Teresa [name]      Date of Birth: [birth year] MRN: [id number] PCP: Alonza Bogus, MD      Visit Date: 07/07/2016   Universal Protocol:    Date/Time: 03/29/185:38 AM  Consent Given By: the patient  Position: PRONE   Additional Comments: Vital signs were monitored before and after the procedure. Patient was prepped and draped in the usual sterile fashion. The correct patient,  procedure, and site was verified.   Injection Procedure Details:  Procedure Site One Meds Administered:  Meds ordered this encounter  Medications  . lidocaine (PF) (XYLOCAINE) 1 % injection 0.3 mL  . methylPREDNISolone acetate (DEPO-MEDROL) injection 80 mg     Laterality: Left  Location/Site:  L4-L5 L5-S1  Needle size: 22 guage  Needle type: Spinal  Needle Placement: Articular  Findings:  -Contrast Used: 1 mL iohexol 180 mg iodine/mL   -Comments: Excellent flow of contrast producing a partial arthrogram.  Procedure Details: The fluoroscope beam is vertically oriented in AP, and the inferior recess is visualized beneath the lower pole of the inferior apophyseal process, which represents the target point for needle insertion. When direct visualization is difficult the target point is located at the medial projection of the vertebral pedicle. The region overlying each aforementioned target is locally anesthetized with a 1 to 2 ml. volume of 1% Lidocaine without Epinephrine.   The spinal needle was inserted into each of the above mentioned facet joints using biplanar fluoroscopic guidance. A 0.25 to 0.5 ml. volume of Isovue-250 was injected and a partial facet joint arthrogram was obtained. A single spot film was obtained of the resulting arthrogram.    One to 1.25 ml of the steroid/anesthetic solution was then injected into each of the facet joints noted above.   Additional Comments:  The patient tolerated the procedure well Dressing: Band-Aid    Post-procedure details: Patient was observed during the  procedure. Post-procedure instructions were reviewed.  Patient left the clinic in stable condition.     Clinical History: Lumbar spine MRI 04/22/2016  L2-3: Moderate central narrowing of the thecal sac with mild bilateral foraminal stenosis, mild displacement of the right L2 nerve in the lateral extraforaminal space, and mild bilateral subarticular lateral recess  stenosis due to disc bulge, left lateral recess and foraminal disc protrusion, and facet arthropathy.   L3-4: Mild bilateral foraminal stenosis with mild bilateral subarticular lateral recess stenosis and mild central narrowing of the thecal sac due to disc bulge, facet arthropathy, and intervertebral spurring.   L4-5: Mild right foraminal stenosis with bilateral subarticular lateral recess stenosis due to disc bulge and facet arthropathy as well as right foraminal intervertebral spurring.   L5-S1: Moderate right foraminal stenosis with borderline bilateral subarticular lateral recess stenosis due to disc bulge and right greater than left facet arthropathy.   IMPRESSION: 1. Lumbar spondylosis and degenerative disc disease, resulting in moderate impingement at L2- 3 and L5-S1; and mild impingement at L1- 2, L3-4, and L4-5, as detailed above.  She reports that she has never smoked. She has never used smokeless tobacco. No results for input(s): HGBA1C, LABURIC in the last 8760 hours.  Objective:  VS:  HT:    WT:   BMI:     BP:132/64  HR:73bpm  TEMP:98.2 F (36.8 C)( )  RESP:100 % Physical Exam  Musculoskeletal:  Patient ambulates without aid with good distal strength she does have concordant pain with extension rotation to the left with improved pain to the right.    Ortho Exam Imaging: No results found.  Past Medical/Family/Surgical/Social History: Medications & Allergies reviewed per EMR Patient Active Problem List   Diagnosis Date Noted  . Multinodular goiter 02/10/2016  . Shoulder impingement syndrome 02/26/2014  . Nevus, non-neoplastic 07/01/2011  . GERD (gastroesophageal reflux disease) 05/21/2011  . RUPTURE ROTATOR CUFF 07/18/2009  . SHOULDER PAIN 07/01/2009  . IMPINGEMENT SYNDROME 07/01/2009  . SUPERIOR GLENOID LABRUM TEAR 07/01/2009  . HYPERLIPIDEMIA 05/31/2009  . TMJ PAIN 05/31/2009  . FIBROMYALGIA 05/31/2009  . FATIGUE 05/31/2009  . TACHYCARDIA 05/31/2009   . DYSPNEA 05/31/2009  . Chest pain, unspecified 05/31/2009   Past Medical History:  Diagnosis Date  . Arthritis   . Depression   . Disc disorder   . Fibromyalgia   . GERD (gastroesophageal reflux disease)   . High cholesterol   . Insomnia    Family History  Problem Relation Age of Onset  . Diabetes Mother   . Stroke Mother   . Kidney disease Mother   . Depression Mother   . Heart disease Mother   . Hypertension Mother   . Clotting disorder Mother   . Arthritis Mother   . Osteoporosis Mother   . Ulcers Father   . GER disease Father   . Alcohol abuse Father   . Diabetes Sister   . Hypertension Sister   . Arthritis Sister   . Asthma Maternal Grandmother   . Colon cancer Neg Hx    Past Surgical History:  Procedure Laterality Date  . BUNIONECTOMY    . FACIAL COSMETIC SURGERY    . TONSILLECTOMY     Social History   Occupational History  . Not on file.   Social History Main Topics  . Smoking status: Never Smoker  . Smokeless tobacco: Never Used  . Alcohol use No  . Drug use: No  . Sexual activity: Not on file

## 2016-07-09 NOTE — Procedures (Signed)
Lumbar Facet Joint Intra-Articular Injection(s) with Fluoroscopic Guidance  Patient: Teresa [name]      Date of Birth: 1951-01-01 MRN: [id number] PCP: Alonza Bogus, MD      Visit Date: 07/07/2016   Universal Protocol:    Date/Time: 03/29/185:38 AM  Consent Given By: the patient  Position: PRONE   Additional Comments: Vital signs were monitored before and after the procedure. Patient was prepped and draped in the usual sterile fashion. The correct patient, procedure, and site was verified.   Injection Procedure Details:  Procedure Site One Meds Administered:  Meds ordered this encounter  Medications  . lidocaine (PF) (XYLOCAINE) 1 % injection 0.3 mL  . methylPREDNISolone acetate (DEPO-MEDROL) injection 80 mg     Laterality: Left  Location/Site:  L4-L5 L5-S1  Needle size: 22 guage  Needle type: Spinal  Needle Placement: Articular  Findings:  -Contrast Used: 1 mL iohexol 180 mg iodine/mL   -Comments: Excellent flow of contrast producing a partial arthrogram.  Procedure Details: The fluoroscope beam is vertically oriented in AP, and the inferior recess is visualized beneath the lower pole of the inferior apophyseal process, which represents the target point for needle insertion. When direct visualization is difficult the target point is located at the medial projection of the vertebral pedicle. The region overlying each aforementioned target is locally anesthetized with a 1 to 2 ml. volume of 1% Lidocaine without Epinephrine.   The spinal needle was inserted into each of the above mentioned facet joints using biplanar fluoroscopic guidance. A 0.25 to 0.5 ml. volume of Isovue-250 was injected and a partial facet joint arthrogram was obtained. A single spot film was obtained of the resulting arthrogram.    One to 1.25 ml of the steroid/anesthetic solution was then injected into each of the facet joints noted above.   Additional Comments:  The patient tolerated the  procedure well Dressing: Band-Aid    Post-procedure details: Patient was observed during the procedure. Post-procedure instructions were reviewed.  Patient left the clinic in stable condition.

## 2016-08-21 ENCOUNTER — Other Ambulatory Visit (HOSPITAL_COMMUNITY): Payer: Self-pay | Admitting: Pulmonary Disease

## 2016-08-21 DIAGNOSIS — J209 Acute bronchitis, unspecified: Secondary | ICD-10-CM | POA: Diagnosis not present

## 2016-08-21 DIAGNOSIS — M545 Low back pain: Secondary | ICD-10-CM | POA: Diagnosis not present

## 2016-08-21 DIAGNOSIS — K210 Gastro-esophageal reflux disease with esophagitis: Secondary | ICD-10-CM | POA: Diagnosis not present

## 2016-08-21 DIAGNOSIS — M5030 Other cervical disc degeneration, unspecified cervical region: Secondary | ICD-10-CM | POA: Diagnosis not present

## 2016-08-21 DIAGNOSIS — Z780 Asymptomatic menopausal state: Secondary | ICD-10-CM

## 2016-08-27 ENCOUNTER — Ambulatory Visit (HOSPITAL_COMMUNITY)
Admission: RE | Admit: 2016-08-27 | Discharge: 2016-08-27 | Disposition: A | Discharge status: Home / Self Care | Payer: PPO | Source: Ambulatory Visit | Attending: Pulmonary Disease | Admitting: Pulmonary Disease

## 2016-08-27 DIAGNOSIS — M85852 Other specified disorders of bone density and structure, left thigh: Secondary | ICD-10-CM | POA: Insufficient documentation

## 2016-08-27 DIAGNOSIS — Z780 Asymptomatic menopausal state: Secondary | ICD-10-CM | POA: Diagnosis not present

## 2016-08-27 LAB — HM DEXA SCAN

## 2016-09-01 ENCOUNTER — Ambulatory Visit (INDEPENDENT_AMBULATORY_CARE_PROVIDER_SITE_OTHER): Payer: PPO | Admitting: Internal Medicine

## 2016-09-01 ENCOUNTER — Encounter (INDEPENDENT_AMBULATORY_CARE_PROVIDER_SITE_OTHER): Payer: Self-pay | Admitting: Internal Medicine

## 2016-09-01 VITALS — BP 120/80 | HR 72 | Temp 98.0°F | Resp 18 | Ht 67.0 in | Wt 153.8 lb

## 2016-09-01 DIAGNOSIS — R634 Abnormal weight loss: Secondary | ICD-10-CM | POA: Diagnosis not present

## 2016-09-01 DIAGNOSIS — K219 Gastro-esophageal reflux disease without esophagitis: Secondary | ICD-10-CM

## 2016-09-01 DIAGNOSIS — R140 Abdominal distension (gaseous): Secondary | ICD-10-CM | POA: Diagnosis not present

## 2016-09-01 MED ORDER — PANTOPRAZOLE SODIUM 40 MG PO TBEC: 40.0000 mg | DELAYED_RELEASE_TABLET | Freq: Two times a day (BID) | ORAL | 5 refills | Status: AC

## 2016-09-01 MED ORDER — SIMETHICONE 180 MG PO CAPS: 180.0000 mg | ORAL_CAPSULE | Freq: Two times a day (BID) | ORAL | 0 refills | Status: AC

## 2016-09-01 NOTE — Progress Notes (Signed)
Presenting complaint;  Follow-up for GERD and weight loss. Patient complains of bloating.  Database and Subjective:  Patient is 66 year old Caucasian female who is here for scheduled visit. She was last seen in October 2017. On her last visit she was complaining of frequent heartburn regurgitation. She is also complaining of burning involving the mouth and tongue. Oral symptoms are felt to be not due to GERD. Pantoprazole was increased to twice daily with control of her heartburn and regurgitation. Regarding her oral symptoms she was referred to Dr.Teoh who started her on gabapentin with significant symptomatic improvement. She does not remember the last time she had heartburn. She did have one episode of note nocturnal regurgitation in the last 6 months. She denies nausea or vomiting. She says tiny and mouth burning has completely resolved with gabapentin. She is not having any side effects with pantoprazole. A new symptom is one of postprandial bloating. When she wakes in the morning her abdomen is flat. There is not much change after breakfast but she feels like a balloon after her lunch and evening meal. This symptom is a source of distress for her. She says she does not eat much because of bloating. She has not lost any more weight. She has gained 3 pounds since her last visit.  Current Medications: Outpatient Encounter Prescriptions as of 09/01/2016  Medication Sig  . amphetamine-dextroamphetamine (ADDERALL XR) 30 MG 24 hr capsule Take 30 mg by mouth daily.   Marland Kitchen azithromycin (ZITHROMAX) 250 MG tablet Take 250 mg by mouth once.   Marland Kitchen buPROPion (WELLBUTRIN SR) 150 MG 12 hr tablet Take 150 mg by mouth 2 (two) times daily.    Marland Kitchen CALCIUM-MAGNESIUM-VITAMIN D PO Take by mouth daily.  Marland Kitchen CINNAMON PO Take 650 mg by mouth. [place] Cinnamon  . DULoxetine (CYMBALTA) 60 MG capsule Take 60 mg by mouth every evening.   . gabapentin (NEURONTIN) 300 MG capsule Take 300 mg by mouth 3 (three) times daily.   .  Ginger, Zingiber officinalis, (GINGER PO) Take by mouth daily.   . Multiple Vitamins-Minerals (MULTIVITAMINS THER. W/MINERALS) TABS Take 1 tablet by mouth every morning.   . pravastatin (PRAVACHOL) 20 MG tablet Take 40 mg by mouth every evening.   . temazepam (RESTORIL) 15 MG capsule Take 15 mg by mouth at bedtime.  . traZODone (DESYREL) 50 MG tablet Take by mouth at bedtime.   . pantoprazole (PROTONIX) 40 MG tablet Take 1 tablet (40 mg total) by mouth 2 (two) times daily before a meal. (Patient not taking: Reported on 09/01/2016)  . [DISCONTINUED] methylphenidate (RITALIN) 20 MG tablet Take 20 mg by mouth 2 (two) times daily.    . [DISCONTINUED] Omega-3 Fatty Acids (FISH OIL PO) Take 1,600 mg by mouth.   . [DISCONTINUED] OVER THE COUNTER MEDICATION Cumin 500 mg qd  . [DISCONTINUED] pantoprazole (PROTONIX) 40 MG tablet TAKE ONE TABLET BY MOUTH ONCE DAILY (Patient not taking: Reported on 07/07/2016)   Facility-Administered Encounter Medications as of 09/01/2016  Medication  . diclofenac sodium (VOLTAREN) 1 % transdermal gel 2 g     Objective: Blood pressure 120/80, pulse 72, temperature 98 F (36.7 C), temperature source Oral, resp. rate 18, height 5\' 7"  (1.702 m), weight 153 lb 12.8 oz (69.8 kg). Teresa [name] is alert and in no acute distress. Conjunctiva is pink. Sclera is nonicteric Oropharyngeal mucosa is normal. No neck masses or thyromegaly noted. Cardiac exam with regular rhythm normal S1 and S2. No murmur or gallop noted. Lungs are clear to auscultation. Abdomen;  No LE edema or clubbing noted.   Assessment:  #1. Chronic GERD. Symptoms well controlled with double dose PPI. Will consider dropping those at future visit. #2. Burning mouth syndrome. She has responded to gabapentin. She may consider dropping the dose to twice a day and see if it still works otherwise she can go back on 3 times a day schedule. #3. Weight loss. Weight loss has stopped. In fact she has gained 3 pounds  since her last visit. #4. Bloating. Will rule out small intestinal bacterial overgrowth.   Plan:  Phazyme 180 mg by mouth twice a day. New prescription given for pantoprazole. Will schedule patient for hydrogen breath test. Office visit in 6 months.

## 2016-09-01 NOTE — Patient Instructions (Addendum)
Breath test to be scheduled to determine if you have small bowel bacterial overgrowth. Decrease gabapentin to twice daily. Please call office with progress report in 2 weeks.

## 2016-09-02 ENCOUNTER — Telehealth (INDEPENDENT_AMBULATORY_CARE_PROVIDER_SITE_OTHER): Payer: Self-pay | Admitting: *Deleted

## 2016-09-02 NOTE — Telephone Encounter (Signed)
Breath test machine hasn't been fixed at [facility] and won't be fixed, sounds like test has become obsolete -- [facility] doesn't have a machine to do the test either -- I've put a call in to [facility] to see if they do the test but haven't heard back yet. Please advise what you want to do since breath test can't be done

## 2016-09-02 NOTE — Telephone Encounter (Signed)
I spoke to [facility] and they do the breath test for bacterial overgrowth -- do you want me to send referral there -- please advise

## 2016-09-03 NOTE — Telephone Encounter (Signed)
Can do it at [facility]

## 2016-09-04 NOTE — Telephone Encounter (Signed)
Referral faxed to [facility], they will contact patient

## 2016-09-30 DIAGNOSIS — Z01419 Encounter for gynecological examination (general) (routine) without abnormal findings: Secondary | ICD-10-CM | POA: Diagnosis not present

## 2016-09-30 DIAGNOSIS — Z124 Encounter for screening for malignant neoplasm of cervix: Secondary | ICD-10-CM | POA: Diagnosis not present

## 2016-09-30 DIAGNOSIS — N930 Postcoital and contact bleeding: Secondary | ICD-10-CM | POA: Diagnosis not present

## 2016-09-30 DIAGNOSIS — Z6823 Body mass index (BMI) 23.0-23.9, adult: Secondary | ICD-10-CM | POA: Diagnosis not present

## 2016-10-05 ENCOUNTER — Ambulatory Visit (INDEPENDENT_AMBULATORY_CARE_PROVIDER_SITE_OTHER): Payer: PPO

## 2016-10-05 ENCOUNTER — Ambulatory Visit (INDEPENDENT_AMBULATORY_CARE_PROVIDER_SITE_OTHER): Payer: PPO | Admitting: Orthopaedic Surgery

## 2016-10-05 ENCOUNTER — Encounter (INDEPENDENT_AMBULATORY_CARE_PROVIDER_SITE_OTHER): Payer: Self-pay | Admitting: Orthopaedic Surgery

## 2016-10-05 DIAGNOSIS — M1811 Unilateral primary osteoarthritis of first carpometacarpal joint, right hand: Secondary | ICD-10-CM

## 2016-10-05 MED ORDER — DICLOFENAC SODIUM 1 % TD GEL: 2.0000 g | Freq: Four times a day (QID) | TRANSDERMAL | 5 refills | Status: DC

## 2016-10-05 NOTE — Progress Notes (Signed)
Office Visit Note   Patient: Teresa [name]           Date of Birth: 1950/04/15           MRN: [id number] Visit Date: 10/05/2016              Requested by: Sinda Du, [address], [place] [id number] PCP: Sinda Du, MD   Assessment & Plan: Visit Diagnoses:  1. Arthritis of carpometacarpal (CMC) joint of right thumb     Plan: Overall pressure is right thumb CMC arthritis. Patient has failed extensive conservative treatments. She has tried injections, braces, oral NSAIDs. We'll try topical NSAIDs. We did discuss CMC arthroplasty and the associated risks benefits alternatives. She is going to think about this and let us know. Follow-up with me as needed.  Follow-Up Instructions: Return if symptoms worsen or fail to improve.   Orders:  Orders Placed This Encounter  Procedures  . XR Finger Thumb Right   Meds ordered this encounter  Medications  . diclofenac sodium (VOLTAREN) 1 % GEL    Sig: Apply 2 g topically 4 (four) times daily.    Dispense:  1 Tube    Refill:  5      Procedures: No procedures performed   Clinical Data: No additional findings.   Subjective: Chief Complaint  Patient presents with  . Right Thumb - Pain, Edema    [name] is a 66 year old female who comes in with right thumb pain for several years. She's had 2 injections with temporary relief. She is having significant difficulty with use of her hand. She denies any triggering or radiation of pain.  She has difficulty opening jars and doorknobs.    Review of Systems  Constitutional: Negative.   HENT: Negative.   Eyes: Negative.   Respiratory: Negative.   Cardiovascular: Negative.   Endocrine: Negative.   Musculoskeletal: Negative.   Neurological: Negative.   Hematological: Negative.   Psychiatric/Behavioral: Negative.   All other systems reviewed and are negative.    Objective: Vital Signs: There were no vitals taken for this visit.  Physical Exam    Constitutional: She is oriented to person, place, and time. She appears well-developed and well-nourished.  HENT:  Head: Normocephalic and atraumatic.  Eyes: EOM are normal.  Neck: Neck supple.  Pulmonary/Chest: Effort normal.  Abdominal: Soft.  Neurological: She is alert and oriented to person, place, and time.  Skin: Skin is warm. Capillary refill takes less than 2 seconds.  Psychiatric: She has a normal mood and affect. Her behavior is normal. Judgment and thought content normal.  Nursing note and vitals reviewed.   Ortho Exam Right hand exam is consistent with a positive grind test. Negative Finkelstein's. No triggering. No evidence of intersection syndrome. Good wrist range of motion. Specialty Comments:  No specialty comments available.  Imaging: Xr Finger Thumb Right  Result Date: 10/05/2016 Advanced DJD of thumb CMC joint    PMFS History: Patient Active Problem List   Diagnosis Date Noted  . Arthritis of carpometacarpal [facility]) joint of right thumb 10/05/2016  . Multinodular goiter 02/10/2016  . Shoulder impingement syndrome 02/26/2014  . Nevus, non-neoplastic 07/01/2011  . GERD (gastroesophageal reflux disease) 05/21/2011  . RUPTURE ROTATOR CUFF 07/18/2009  . SHOULDER PAIN 07/01/2009  . IMPINGEMENT SYNDROME 07/01/2009  . SUPERIOR GLENOID LABRUM TEAR 07/01/2009  . HYPERLIPIDEMIA 05/31/2009  . TMJ PAIN 05/31/2009  . FIBROMYALGIA 05/31/2009  . FATIGUE 05/31/2009  . TACHYCARDIA 05/31/2009  . DYSPNEA 05/31/2009  .  Chest pain, unspecified 05/31/2009   Past Medical History:  Diagnosis Date  . Arthritis   . Depression   . Disc disorder   . Fibromyalgia   . GERD (gastroesophageal reflux disease)   . High cholesterol   . Insomnia     Family History  Problem Relation Age of Onset  . Diabetes Mother   . Stroke Mother   . Kidney disease Mother   . Depression Mother   . Heart disease Mother   . Hypertension Mother   . Clotting disorder Mother   . Arthritis  Mother   . Osteoporosis Mother   . Ulcers Father   . GER disease Father   . Alcohol abuse Father   . Diabetes Sister   . Hypertension Sister   . Arthritis Sister   . Asthma Maternal Grandmother   . Colon cancer Neg Hx     Past Surgical History:  Procedure Laterality Date  . BUNIONECTOMY    . FACIAL COSMETIC SURGERY    . TONSILLECTOMY     Social History   Occupational History  . Not on file.   Social History Main Topics  . Smoking status: Never Smoker  . Smokeless tobacco: Never Used  . Alcohol use No  . Drug use: No  . Sexual activity: Not on file

## 2016-11-12 DIAGNOSIS — R010 Benign and innocent cardiac murmurs: Secondary | ICD-10-CM | POA: Diagnosis not present

## 2016-11-12 DIAGNOSIS — R739 Hyperglycemia, unspecified: Secondary | ICD-10-CM | POA: Diagnosis not present

## 2016-11-12 DIAGNOSIS — M791 Myalgia: Secondary | ICD-10-CM | POA: Diagnosis not present

## 2016-11-12 DIAGNOSIS — E875 Hyperkalemia: Secondary | ICD-10-CM | POA: Diagnosis not present

## 2016-11-12 DIAGNOSIS — G4700 Insomnia, unspecified: Secondary | ICD-10-CM | POA: Diagnosis not present

## 2016-11-12 DIAGNOSIS — E559 Vitamin D deficiency, unspecified: Secondary | ICD-10-CM | POA: Diagnosis not present

## 2016-11-12 DIAGNOSIS — R233 Spontaneous ecchymoses: Secondary | ICD-10-CM | POA: Diagnosis not present

## 2016-11-12 DIAGNOSIS — M19029 Primary osteoarthritis, unspecified elbow: Secondary | ICD-10-CM | POA: Diagnosis not present

## 2016-11-12 DIAGNOSIS — M5030 Other cervical disc degeneration, unspecified cervical region: Secondary | ICD-10-CM | POA: Diagnosis not present

## 2016-11-12 DIAGNOSIS — F329 Major depressive disorder, single episode, unspecified: Secondary | ICD-10-CM | POA: Diagnosis not present

## 2016-12-17 ENCOUNTER — Other Ambulatory Visit (HOSPITAL_COMMUNITY): Payer: Self-pay | Admitting: Pulmonary Disease

## 2016-12-17 DIAGNOSIS — Z1231 Encounter for screening mammogram for malignant neoplasm of breast: Secondary | ICD-10-CM

## 2016-12-24 ENCOUNTER — Ambulatory Visit (HOSPITAL_COMMUNITY): Payer: PPO

## 2016-12-31 ENCOUNTER — Ambulatory Visit (HOSPITAL_COMMUNITY)
Admission: RE | Admit: 2016-12-31 | Discharge: 2016-12-31 | Disposition: A | Discharge status: Home / Self Care | Payer: PPO | Source: Ambulatory Visit | Attending: Pulmonary Disease | Admitting: Pulmonary Disease

## 2016-12-31 DIAGNOSIS — Z1231 Encounter for screening mammogram for malignant neoplasm of breast: Secondary | ICD-10-CM | POA: Insufficient documentation

## 2016-12-31 LAB — HM MAMMOGRAPHY

## 2017-02-01 ENCOUNTER — Telehealth (INDEPENDENT_AMBULATORY_CARE_PROVIDER_SITE_OTHER): Payer: Self-pay | Admitting: Physical Medicine and Rehabilitation

## 2017-02-01 NOTE — Telephone Encounter (Signed)
Yes okay to repeat if helpful

## 2017-02-02 NOTE — Telephone Encounter (Signed)
Need auth for bilateral [id number], [id number]. Patient nor yet scheduled.

## 2017-02-03 NOTE — Telephone Encounter (Signed)
Submitted auth request on acuity website

## 2017-02-04 NOTE — Telephone Encounter (Signed)
Received auth for [phone] and [phone]. [id number]. eff 02/03/17-05/04/16. Called pt and scheduled her for 02/15/17 @ 2:15.

## 2017-02-15 ENCOUNTER — Ambulatory Visit (INDEPENDENT_AMBULATORY_CARE_PROVIDER_SITE_OTHER): Payer: PPO | Admitting: Physical Medicine and Rehabilitation

## 2017-02-15 ENCOUNTER — Encounter (INDEPENDENT_AMBULATORY_CARE_PROVIDER_SITE_OTHER): Payer: Self-pay | Admitting: Physical Medicine and Rehabilitation

## 2017-02-15 ENCOUNTER — Ambulatory Visit (INDEPENDENT_AMBULATORY_CARE_PROVIDER_SITE_OTHER): Payer: PPO

## 2017-02-15 VITALS — BP 131/61 | HR 77 | Temp 97.9°F

## 2017-02-15 DIAGNOSIS — M47816 Spondylosis without myelopathy or radiculopathy, lumbar region: Secondary | ICD-10-CM | POA: Diagnosis not present

## 2017-02-15 MED ORDER — METHYLPREDNISOLONE ACETATE 80 MG/ML IJ SUSP
80.0000 mg | Freq: Once | INTRAMUSCULAR | Status: AC
  Administered 2017-02-15: 80 mg

## 2017-02-15 MED ORDER — LIDOCAINE HCL (PF) 1 % IJ SOLN
2.0000 mL | Freq: Once | INTRAMUSCULAR | Status: AC
  Administered 2017-02-15: 2 mL

## 2017-02-15 NOTE — Patient Instructions (Signed)

## 2017-02-15 NOTE — Progress Notes (Deleted)
Lower back pain, right and left equal. Sometimes radiates down right thigh.

## 2017-02-21 NOTE — Procedures (Signed)
Teresa [name] is a 66 year old female with chronic bilateral low back pain which is mechanical worse with standing and ambulating.  This seems to be facet mediated low back pain with no stenosis or focal nerve compression on imaging.  No radicular pain.  In March we completed right-sided injections followed by left that she did well up until just recently.  Believe bilateral facet joint blocks today.  If there is short-lived but do help we would look at double block medial branches and radiofrequency ablation.  Her case is complicated by fibromyalgia.  Lumbar Facet Joint Intra-Articular Injection(s) with Fluoroscopic Guidance  Patient: Teresa [name]      Date of Birth: 02-19-1951 MRN: [id number] PCP: Sinda Du, MD      Visit Date: 02/15/2017   Universal Protocol:    Date/Time: 02/15/2017  Consent Given By: the patient  Position: PRONE   Additional Comments: Vital signs were monitored before and after the procedure. Patient was prepped and draped in the usual sterile fashion. The correct patient, procedure, and site was verified.   Injection Procedure Details:  Procedure Site One Meds Administered:  Meds ordered this encounter  Medications  . lidocaine (PF) (XYLOCAINE) 1 % injection 2 mL  . methylPREDNISolone acetate (DEPO-MEDROL) injection 80 mg     Laterality: Bilateral  Location/Site:  L4-L5 L5-S1  Needle size: 22 guage  Needle type: Spinal  Needle Placement: Articular  Findings:  -Contrast Used: 1 mL iohexol 180 mg iodine/mL   -Comments: Excellent flow of contrast producing a partial arthrogram.  Procedure Details: The fluoroscope beam is vertically oriented in AP, and the inferior recess is visualized beneath the lower pole of the inferior apophyseal process, which represents the target point for needle insertion. When direct visualization is difficult the target point is located at the medial projection of the vertebral pedicle. The region overlying each  aforementioned target is locally anesthetized with a 1 to 2 ml. volume of 1% Lidocaine without Epinephrine.   The spinal needle was inserted into each of the above mentioned facet joints using biplanar fluoroscopic guidance. A 0.25 to 0.5 ml. volume of Isovue-250 was injected and a partial facet joint arthrogram was obtained. A single spot film was obtained of the resulting arthrogram.    One to 1.25 ml of the steroid/anesthetic solution was then injected into each of the facet joints noted above.   Additional Comments:  The patient tolerated the procedure well Dressing: Band-Aid    Post-procedure details: Patient was observed during the procedure. Post-procedure instructions were reviewed.  Patient left the clinic in stable condition.

## 2017-02-23 ENCOUNTER — Ambulatory Visit: Payer: PPO | Admitting: "Endocrinology

## 2017-02-24 DIAGNOSIS — M25519 Pain in unspecified shoulder: Secondary | ICD-10-CM | POA: Diagnosis not present

## 2017-02-24 DIAGNOSIS — M5030 Other cervical disc degeneration, unspecified cervical region: Secondary | ICD-10-CM | POA: Diagnosis not present

## 2017-02-24 DIAGNOSIS — G4700 Insomnia, unspecified: Secondary | ICD-10-CM | POA: Diagnosis not present

## 2017-02-24 DIAGNOSIS — R739 Hyperglycemia, unspecified: Secondary | ICD-10-CM | POA: Diagnosis not present

## 2017-02-24 DIAGNOSIS — M791 Myalgia, unspecified site: Secondary | ICD-10-CM | POA: Diagnosis not present

## 2017-02-24 DIAGNOSIS — E559 Vitamin D deficiency, unspecified: Secondary | ICD-10-CM | POA: Diagnosis not present

## 2017-02-24 DIAGNOSIS — K210 Gastro-esophageal reflux disease with esophagitis: Secondary | ICD-10-CM | POA: Diagnosis not present

## 2017-02-24 DIAGNOSIS — F329 Major depressive disorder, single episode, unspecified: Secondary | ICD-10-CM | POA: Diagnosis not present

## 2017-02-24 DIAGNOSIS — M19012 Primary osteoarthritis, left shoulder: Secondary | ICD-10-CM | POA: Diagnosis not present

## 2017-02-24 DIAGNOSIS — E785 Hyperlipidemia, unspecified: Secondary | ICD-10-CM | POA: Diagnosis not present

## 2017-03-09 ENCOUNTER — Ambulatory Visit (INDEPENDENT_AMBULATORY_CARE_PROVIDER_SITE_OTHER): Payer: PPO | Admitting: Internal Medicine

## 2017-03-10 ENCOUNTER — Other Ambulatory Visit (HOSPITAL_COMMUNITY): Payer: Self-pay | Admitting: Pulmonary Disease

## 2017-03-10 ENCOUNTER — Ambulatory Visit (HOSPITAL_COMMUNITY)
Admission: RE | Admit: 2017-03-10 | Discharge: 2017-03-10 | Disposition: A | Discharge status: Home / Self Care | Payer: PPO | Source: Ambulatory Visit | Attending: Pulmonary Disease | Admitting: Pulmonary Disease

## 2017-03-10 DIAGNOSIS — R319 Hematuria, unspecified: Secondary | ICD-10-CM | POA: Diagnosis not present

## 2017-03-10 DIAGNOSIS — R310 Gross hematuria: Secondary | ICD-10-CM | POA: Diagnosis not present

## 2017-03-10 DIAGNOSIS — R935 Abnormal findings on diagnostic imaging of other abdominal regions, including retroperitoneum: Secondary | ICD-10-CM | POA: Diagnosis not present

## 2017-05-21 DIAGNOSIS — M791 Myalgia, unspecified site: Secondary | ICD-10-CM | POA: Diagnosis not present

## 2017-05-21 DIAGNOSIS — M5030 Other cervical disc degeneration, unspecified cervical region: Secondary | ICD-10-CM | POA: Diagnosis not present

## 2017-05-21 DIAGNOSIS — F321 Major depressive disorder, single episode, moderate: Secondary | ICD-10-CM | POA: Diagnosis not present

## 2017-05-21 DIAGNOSIS — M545 Low back pain: Secondary | ICD-10-CM | POA: Diagnosis not present

## 2017-06-08 ENCOUNTER — Telehealth (INDEPENDENT_AMBULATORY_CARE_PROVIDER_SITE_OTHER): Payer: Self-pay | Admitting: Physical Medicine and Rehabilitation

## 2017-06-08 NOTE — Telephone Encounter (Signed)
Patient wanted an OV. Scheduled for 06/24/17 at 0915.

## 2017-06-08 NOTE — Telephone Encounter (Signed)
Ok

## 2017-06-24 ENCOUNTER — Encounter (INDEPENDENT_AMBULATORY_CARE_PROVIDER_SITE_OTHER): Payer: Self-pay | Admitting: Physical Medicine and Rehabilitation

## 2017-06-24 ENCOUNTER — Ambulatory Visit (INDEPENDENT_AMBULATORY_CARE_PROVIDER_SITE_OTHER): Payer: PPO | Admitting: Physical Medicine and Rehabilitation

## 2017-06-24 VITALS — BP 123/59 | HR 80

## 2017-06-24 DIAGNOSIS — M48061 Spinal stenosis, lumbar region without neurogenic claudication: Secondary | ICD-10-CM | POA: Diagnosis not present

## 2017-06-24 DIAGNOSIS — M461 Sacroiliitis, not elsewhere classified: Secondary | ICD-10-CM

## 2017-06-24 DIAGNOSIS — M1811 Unilateral primary osteoarthritis of first carpometacarpal joint, right hand: Secondary | ICD-10-CM

## 2017-06-24 DIAGNOSIS — M797 Fibromyalgia: Secondary | ICD-10-CM

## 2017-06-24 DIAGNOSIS — M47816 Spondylosis without myelopathy or radiculopathy, lumbar region: Secondary | ICD-10-CM | POA: Diagnosis not present

## 2017-06-24 DIAGNOSIS — M4802 Spinal stenosis, cervical region: Secondary | ICD-10-CM | POA: Diagnosis not present

## 2017-06-24 MED ORDER — MELOXICAM 15 MG PO TABS
15.0000 mg | ORAL_TABLET | Freq: Every day | ORAL | 0 refills | Status: DC
Start: 2017-06-24 — End: 2017-12-20

## 2017-06-24 NOTE — Progress Notes (Signed)
Numeric Pain Rating Scale and Functional Assessment Average Pain 5 Pain Right Now 6 My pain is constant, burning, stabbing and aching Pain is worse with: walking and some activites Pain improves with: Nothing   In the last MONTH (on 0-10 scale) has pain interfered with the following?  1. General activity like being  able to carry out your everyday physical activities such as walking, climbing stairs, carrying groceries, or moving a chair?  Rating(7)  2. Relation with others like being able to carry out your usual social activities and roles such as  activities at home, at work and in your community. Rating(9)  3. Enjoyment of life such that you have  been bothered by emotional problems such as feeling anxious, depressed or irritable?  Rating(6)

## 2017-06-24 NOTE — Progress Notes (Signed)
Teresa [name] - 67 y.o. female MRN [id number]  Date of birth: [birth year]  Office Visit Note: Visit Date: 06/24/2017 PCP: Sinda Du, MD Referred by: Sinda Du, MD  Subjective: Chief Complaint  Patient presents with  . Lower Back - Pain  . Left Hip - Pain  . Right Hip - Pain  . Neck - Pain  . Right Hand - Pain   HPI: Teresa [name] is a 67 year old female with chronic worsening low back pain and now some pain over the bilateral SI joints and buttock area.  She also reports pain over the greater trochanters when laying on the side left and right.  She has no radicular pain or leg pain.  She has no paresthesias.  She in general has a lot of pain really in all positions most of the time of day.  She reports a constant burning stabbing aching pain.  She reports severe pain with activity and better at rest but is still bothersome.  It really does affect her activities of daily living.  Unfortunately she has a complicated history of fibromyalgia, history of depression and arthritis in general.  We have completed facet joint blocks in the past with decent relief of her back pain temporarily.  She has had physical therapy she has had medication trials.  She is currently not taking an anti-inflammatory and is scared to take those medications because of situation with her husband taken those.  She does take Cymbalta 60 mg.  She has been on gabapentin but is not taking that currently.  She has difficulty sleeping at night and does not sleep very well at all.  She does have fatigue.  She is not really exercising much.  She wonders what she can do for her back there is really not related to injections.  She also reports right thumb pain with osteoarthritis of the CMC joint.  She is been followed by Dr. Erlinda Hong in our office for that.  She also endorses neck pain.  Her primary care physician had obtained an MRI of the cervical spine.  I did review that today as well as reviewed below.  She does have  multifactorial moderate stenosis at C5-6 with some arthritis.  She has perineural cysts which are really something that she is really asymptomatic.    Review of Systems  Constitutional: Negative for chills, fever, malaise/fatigue and weight loss.  HENT: Negative for hearing loss and sinus pain.   Eyes: Negative for blurred vision, double vision and photophobia.  Respiratory: Negative for cough and shortness of breath.   Cardiovascular: Negative for chest pain, palpitations and leg swelling.  Gastrointestinal: Negative for abdominal pain, nausea and vomiting.  Genitourinary: Negative for flank pain.  Musculoskeletal: Positive for back pain, joint pain and neck pain. Negative for myalgias.  Skin: Negative for itching and rash.  Neurological: Negative for tremors, focal weakness and weakness.  Endo/Heme/Allergies: Negative.   Psychiatric/Behavioral: Negative for depression.  All other systems reviewed and are negative.  Otherwise per HPI.  Assessment & Plan: Visit Diagnoses:  1. Spondylosis without myelopathy or radiculopathy, lumbar region   2. Spinal stenosis of lumbar region without neurogenic claudication   3. Sacroiliitis ([place])   4. Fibromyalgia   5. Spinal stenosis of cervical region   6. Arthritis of carpometacarpal (CMC) joint of right thumb     Plan: Findings:  Chronic pain syndrome which is exacerbated by fibromyalgia and anxiety depression but she does have consistent lumbar spine spondylosis and facet arthropathy with moderate  multifactorial stenosis at L2-3.  She has mostly symptoms of axial pain consistent with more mechanical complaints of facet arthropathy.  Diagnostically she has had relief with prior facet joint block.  She really is not very keen on injections.  I did respond though that completing bilateral medial branch blocks would be more diagnostic and we could look at something like radiofrequency ablation.  We also talked about possibly epidural injection for  stenosis.  We talked about other avenues which would be surgical which she is not really keen on either.  We talked about medication management which she is really had before.  I think the next step would be temporary stoppage of her statin medication for 1 month along with switching her back to gabapentin at night so she can not take the trazodone she does not like to take.  We have talked about sleep hygiene and waking up at the same time every morning.  She is going to try these avenues for the first month and she will get back with [place] in a couple weeks and see how she is doing overall.  If going off of the statin does not make much difference then she should continue to take the statin and review that with her doctor that has her taking it.  Is not really much risk in stopping that for 3-4 weeks.  I am also going to prescribe meloxicam for take after she determines if the statin drug makes any difference at all.  I will see her back in a month and we can discuss injection treatment again are medication differences.  She does not really represent a great candidate as no one really does for chronic opioid therapy.  We talked about exercise as well.  She is going to try to start doing some changes in her lifestyle.  Lastly I think she may have some bursitis but again is hard to tell with tender points on exam.  In terms of her cervical spine at this point we are going to just wait and see how she does with these first changes in her lifestyle but obviously epidural injection of the cervical spine could be looked at potentially.  In terms of her [facility] joint arthritis of her hands she will continue to follow with Dr. Erlinda Hong but she is asked about any hand surgeons in town.  I did give her recommendations for [facility] as well as Dr. Amedeo Plenty.    Meds & Orders:  Meds ordered this encounter  Medications  . meloxicam (MOBIC) 15 MG tablet    Sig: Take 1 tablet (15 mg total) by mouth daily. Take with food    Dispense:   30 tablet    Refill:  0   No orders of the defined types were placed in this encounter.   Follow-up: Return in about 4 weeks (around 07/22/2017).   Procedures: No procedures performed  No notes on file   Clinical History: Lumbar spine MRI 04/22/2016  L2-3: Moderate central narrowing of the thecal sac with mild bilateral foraminal stenosis, mild displacement of the right L2 nerve in the lateral extraforaminal space, and mild bilateral subarticular lateral recess stenosis due to disc bulge, left lateral recess and foraminal disc protrusion, and facet arthropathy.  L3-4: Mild bilateral foraminal stenosis with mild bilateral subarticular lateral recess stenosis and mild central narrowing of the thecal sac due to disc bulge, facet arthropathy, and intervertebral spurring.  L4-5: Mild right foraminal stenosis with bilateral subarticular lateral recess stenosis due to  disc bulge and facet arthropathy as well as right foraminal intervertebral spurring.  L5-S1: Moderate right foraminal stenosis with borderline bilateral subarticular lateral recess stenosis due to disc bulge and right greater than left facet arthropathy.  IMPRESSION: 1. Lumbar spondylosis and degenerative disc disease, resulting in moderate impingement at L2- 3 and L5-S1; and mild impingement at L1- 2, L3-4, and L4-5, as detailed above.  MRI CERVICAL SPINE WITHOUT CONTRAST  TECHNIQUE: Multiplanar, multisequence MR imaging of the cervical spine was performed. No intravenous contrast was administered.  COMPARISON:  Multiple exams, including 02/29/2016 radiographs and 09/10/2015 CT scan  FINDINGS: Alignment: 2 mm degenerative retrolisthesis at C5-6.  Vertebrae: Degenerative endplate disease at S4-9 and C6-7 with associated spurring. Disc desiccation throughout the cervical spine with loss of disc height severe at C5-6 and C6-7.  Cord: No significant abnormal spinal cord signal is observed.  Slight deviation of the cord due to the central narrowing of the thecal sac at C5-6 and C6-7.  Posterior Fossa, vertebral arteries, paraspinal tissues: Subtle accentuated T2 signal centrally in the pons likely due to chronic ischemic microvascular white matter disease, for example image 6/6.  Disc levels:  C2-3:  Unremarkable.  C3-4:  No impingement, mild bilateral facet arthropathy.  C4-5:  No impingement, bilateral facet arthropathy.  C5-6: Moderate central narrowing of the thecal sac with moderate to prominent left and mild right foraminal stenosis due to disc osteophyte complex and facet arthropathy.  C6-7: Moderate bilateral foraminal stenosis and moderate right eccentric central narrowing of the thecal sac due to disc osteophyte complex and facet arthropathy. Left foraminal perineural cyst.  C7-T1:  No impingement.  Bilateral perineural cysts.  T1- 2:  No impingement.  Bilateral perineural cysts.  IMPRESSION: 1. Cervical spondylosis and degenerative disc disease, causing moderate impingement at C5-6 and C6-7 as detailed above. 2. Perineural cysts at several levels. These are only very rarely implicated as a cause for symptoms. 3. Subtle increased T2 signal in the central pons probably from chronic ischemic microvascular white matter disease.   Electronically Signed   By: Van Clines M.D.   On: 04/22/2016 14:15   She reports that  has never smoked. she has never used smokeless tobacco. No results for input(s): HGBA1C, LABURIC in the last 8760 hours.  Objective:  VS:  HT:    WT:   BMI:     BP:(!) 123/59  HR:80bpm  TEMP: ( )  RESP:  Physical Exam  Constitutional: She is oriented to person, place, and time. She appears well-developed and well-nourished. No distress.  HENT:  Head: Normocephalic and atraumatic.  Nose: Nose normal.  Mouth/Throat: Oropharynx is clear and moist.  Eyes: Conjunctivae are normal. Pupils are equal, round, and  reactive to light.  Neck: Normal range of motion. Neck supple. No JVD present.  Cardiovascular: Regular rhythm and intact distal pulses.  Pulmonary/Chest: Effort normal. No respiratory distress.  Abdominal: She exhibits no distension. There is no guarding.  Musculoskeletal:  Cervical spine exam shows forward flexed cervical spine with reduced range of motion with right more than left extension rotation.  She has good distal strength in the lower limbs without clonus.  She has no pain with hip rotation.  She does have tenderness over the greater trochanters but really has tenderness across the low back as well with tender points.  Examination of the hands shows good strength in both hands she does have [place] joint arthritis right more than left.  Neurological: She is alert and oriented to person, place, and  time. She exhibits normal muscle tone. Coordination normal.  Skin: Skin is warm. No rash noted. No erythema.  Psychiatric: She has a normal mood and affect. Her behavior is normal.  Nursing note and vitals reviewed.   Ortho Exam Imaging: No results found.  Past Medical/Family/Surgical/Social History: Medications & Allergies reviewed per EMR, new medications updated. Patient Active Problem List   Diagnosis Date Noted  . Arthritis of carpometacarpal [facility]) joint of right thumb 10/05/2016  . Multinodular goiter 02/10/2016  . Shoulder impingement syndrome 02/26/2014  . Nevus, non-neoplastic 07/01/2011  . GERD (gastroesophageal reflux disease) 05/21/2011  . RUPTURE ROTATOR CUFF 07/18/2009  . SHOULDER PAIN 07/01/2009  . IMPINGEMENT SYNDROME 07/01/2009  . SUPERIOR GLENOID LABRUM TEAR 07/01/2009  . HYPERLIPIDEMIA 05/31/2009  . TMJ PAIN 05/31/2009  . FIBROMYALGIA 05/31/2009  . FATIGUE 05/31/2009  . TACHYCARDIA 05/31/2009  . DYSPNEA 05/31/2009  . Chest pain, unspecified 05/31/2009   Past Medical History:  Diagnosis Date  . Arthritis   . Depression   . Disc disorder   . Fibromyalgia    . GERD (gastroesophageal reflux disease)   . High cholesterol   . Insomnia    Family History  Problem Relation Age of Onset  . Diabetes Mother   . Stroke Mother   . Kidney disease Mother   . Depression Mother   . Heart disease Mother   . Hypertension Mother   . Clotting disorder Mother   . Arthritis Mother   . Osteoporosis Mother   . Ulcers Father   . GER disease Father   . Alcohol abuse Father   . Diabetes Sister   . Hypertension Sister   . Arthritis Sister   . Asthma Maternal Grandmother   . Colon cancer Neg Hx    Past Surgical History:  Procedure Laterality Date  . BUNIONECTOMY    . FACIAL COSMETIC SURGERY    . TONSILLECTOMY     Social History   Occupational History  . Not on file  Tobacco Use  . Smoking status: Never Smoker  . Smokeless tobacco: Never Used  Substance and Sexual Activity  . Alcohol use: No    Alcohol/week: 0.0 oz  . Drug use: No  . Sexual activity: Not on file

## 2017-07-29 ENCOUNTER — Ambulatory Visit (INDEPENDENT_AMBULATORY_CARE_PROVIDER_SITE_OTHER): Payer: PPO | Admitting: Physical Medicine and Rehabilitation

## 2017-09-14 ENCOUNTER — Encounter (INDEPENDENT_AMBULATORY_CARE_PROVIDER_SITE_OTHER): Payer: Self-pay | Admitting: Internal Medicine

## 2017-09-14 ENCOUNTER — Ambulatory Visit (INDEPENDENT_AMBULATORY_CARE_PROVIDER_SITE_OTHER): Payer: PPO | Admitting: Internal Medicine

## 2017-09-14 VITALS — BP 110/80 | HR 70 | Temp 97.8°F | Resp 18 | Ht 67.0 in | Wt 150.3 lb

## 2017-09-14 DIAGNOSIS — K2100 Gastro-esophageal reflux disease with esophagitis, without bleeding: Secondary | ICD-10-CM

## 2017-09-14 DIAGNOSIS — Z789 Other specified health status: Secondary | ICD-10-CM | POA: Diagnosis not present

## 2017-09-14 NOTE — Patient Instructions (Signed)
Notify if you have any more episodes of swallowing difficulty. Try taking Pepcid OTC 20 mg in the evening instead of a second dose of pantoprazole.  Pepcid OTC does not control heartburn can go back to pantoprazole twice daily. Screening colonoscopy to be scheduled.

## 2017-09-14 NOTE — Progress Notes (Signed)
Presenting complaint;  Follow-up for chronic GERD.  Patient has questions about the pancreas.  Subjective:  Patient is 67 year old Caucasian female who has chronic GERD and is here for yearly exam.  She was last seen 1 year ago.  As recommended she tried decreasing pantoprazole dose to once a day but it did not work.  She began to have heartburn same night.  She is back on pantoprazole twice daily and doing well.  She rarely has heartburn.  She did experience single episode of dysphagia few weeks ago.  She believes she was eating chicken salad too fast.  She denies throat symptoms.  Her bowels are regular.  She denies melena or rectal bleeding.  She wonders if she should be screened for pancreatic disease.  However she is never had pancreatitis and family history is negative for pancreatic diseases and she does not drink alcohol.  Her appetite is good.  She is watching her diet.  She has lost 3 pounds since her last visit.  She states she is due for colonoscopy. She had a tick bite recently and was begun on doxycycline last week.  She will continue with through 09/19/2017.  She is not having any problems with this medication.  Current Medications: Outpatient Encounter Medications as of 09/14/2017  Medication Sig  . buPROPion (WELLBUTRIN SR) 150 MG 12 hr tablet Take 150 mg by mouth 2 (two) times daily.    Marland Kitchen CALCIUM-MAGNESIUM-VITAMIN D PO Take by mouth daily.  Marland Kitchen CINNAMON PO Take 650 mg by mouth. [place] Cinnamon  . diclofenac sodium (VOLTAREN) 1 % GEL Apply 2 g topically 4 (four) times daily.  Marland Kitchen doxycycline (VIBRAMYCIN) 100 MG capsule Take 100 mg by mouth 2 (two) times daily.   . DULoxetine (CYMBALTA) 60 MG capsule Take 60 mg by mouth every evening.   . gabapentin (NEURONTIN) 300 MG capsule Take 300 mg by mouth 3 (three) times daily.   . meloxicam (MOBIC) 15 MG tablet Take 1 tablet (15 mg total) by mouth daily. Take with food  . Multiple Vitamins-Minerals (MULTIVITAMINS THER. W/MINERALS) TABS Take 1  tablet by mouth every morning.   . pantoprazole (PROTONIX) 40 MG tablet Take 1 tablet (40 mg total) by mouth 2 (two) times daily before a meal.  . pravastatin (PRAVACHOL) 20 MG tablet Take 40 mg by mouth every evening.   Marland Kitchen PREMARIN vaginal cream Place 1 Applicatorful vaginally at bedtime.   . Simethicone (PHAZYME) 180 MG CAPS Take 1 capsule (180 mg total) by mouth 2 (two) times daily.  . temazepam (RESTORIL) 15 MG capsule Take 15 mg by mouth at bedtime.  . traZODone (DESYREL) 50 MG tablet Take by mouth at bedtime.   Marland Kitchen amphetamine-dextroamphetamine (ADDERALL XR) 30 MG 24 hr capsule Take 30 mg by mouth daily.   . [DISCONTINUED] azithromycin (ZITHROMAX) 250 MG tablet Take 250 mg by mouth once.   . [DISCONTINUED] Ginger, Zingiber officinalis, (GINGER PO) Take by mouth daily.    Facility-Administered Encounter Medications as of 09/14/2017  Medication  . diclofenac sodium (VOLTAREN) 1 % transdermal gel 2 g     Objective: Blood pressure 110/80, pulse 70, temperature 97.8 F (36.6 C), temperature source Oral, resp. rate 18, height 5\' 7"  (1.702 m), weight 150 lb 4.8 oz (68.2 kg). Patient is alert and in no acute distress. Conjunctiva is pink. Sclera is nonicteric Oropharyngeal mucosa is normal. No neck masses or thyromegaly noted. Cardiac exam with regular rhythm normal S1 and S2. No murmur or gallop noted. Lungs are clear to  auscultation. Abdomen is symmetrical soft and nontender without organomegaly or masses. No LE edema or clubbing noted.   Assessment:  #1.  Chronic GERD.  She is doing well with therapy.  She remains on double dose PPI.  She did not do well with dose reduction.  Therefore she will try Pepcid OTC in the evening in place of pantoprazole but continue a.m. dose as before.  Will change prescription if combination of pantoprazole in a.m. and Pepcid OTC in the p.m. works.  Patient had single episode of dysphagia.  Will monitor the symptoms.  #2.  Patient is average risk for CRC.   Last colonoscopy was in August 2007 revealing small external hemorrhoids.  She is due for screening colonoscopy.   Plan:  Patient will try Pepcid OTC 20 mg p.o. every afternoon.  Continue pantoprazole 40 mg p.o. every morning. Patient will call with progress report in few weeks. Screening colonoscopy to be scheduled. Office visit in 1 year.

## 2017-09-15 ENCOUNTER — Other Ambulatory Visit (INDEPENDENT_AMBULATORY_CARE_PROVIDER_SITE_OTHER): Payer: Self-pay | Admitting: *Deleted

## 2017-09-15 DIAGNOSIS — Z1211 Encounter for screening for malignant neoplasm of colon: Secondary | ICD-10-CM

## 2017-09-16 ENCOUNTER — Telehealth (INDEPENDENT_AMBULATORY_CARE_PROVIDER_SITE_OTHER): Payer: Self-pay | Admitting: *Deleted

## 2017-09-16 ENCOUNTER — Encounter (INDEPENDENT_AMBULATORY_CARE_PROVIDER_SITE_OTHER): Payer: Self-pay | Admitting: *Deleted

## 2017-09-16 DIAGNOSIS — Z1211 Encounter for screening for malignant neoplasm of colon: Secondary | ICD-10-CM | POA: Insufficient documentation

## 2017-09-16 NOTE — Telephone Encounter (Signed)
Patient needs trilyte 

## 2017-09-20 MED ORDER — PEG 3350-KCL-NA BICARB-NACL 420 G PO SOLR: 4000.0000 mL | Freq: Once | ORAL | 0 refills | Status: AC

## 2017-10-18 ENCOUNTER — Other Ambulatory Visit (HOSPITAL_COMMUNITY): Payer: PPO

## 2017-10-22 ENCOUNTER — Ambulatory Visit (HOSPITAL_COMMUNITY): Admission: RE | Admit: 2017-10-22 | Payer: PPO | Source: Ambulatory Visit | Admitting: Internal Medicine

## 2017-10-22 ENCOUNTER — Encounter (HOSPITAL_COMMUNITY): Admission: RE | Payer: Self-pay | Source: Ambulatory Visit

## 2017-10-22 SURGERY — COLONOSCOPY WITH PROPOFOL
Anesthesia: Monitor Anesthesia Care

## 2017-11-19 DIAGNOSIS — R1010 Upper abdominal pain, unspecified: Secondary | ICD-10-CM | POA: Diagnosis not present

## 2017-11-19 DIAGNOSIS — M791 Myalgia, unspecified site: Secondary | ICD-10-CM | POA: Diagnosis not present

## 2017-11-19 DIAGNOSIS — E559 Vitamin D deficiency, unspecified: Secondary | ICD-10-CM | POA: Diagnosis not present

## 2017-11-19 DIAGNOSIS — R634 Abnormal weight loss: Secondary | ICD-10-CM | POA: Diagnosis not present

## 2017-11-25 ENCOUNTER — Other Ambulatory Visit (INDEPENDENT_AMBULATORY_CARE_PROVIDER_SITE_OTHER): Payer: Self-pay | Admitting: *Deleted

## 2017-11-25 DIAGNOSIS — Z1211 Encounter for screening for malignant neoplasm of colon: Secondary | ICD-10-CM

## 2017-11-26 ENCOUNTER — Other Ambulatory Visit (HOSPITAL_COMMUNITY): Payer: Self-pay | Admitting: Pulmonary Disease

## 2017-11-26 ENCOUNTER — Encounter (INDEPENDENT_AMBULATORY_CARE_PROVIDER_SITE_OTHER): Payer: Self-pay | Admitting: *Deleted

## 2017-11-26 ENCOUNTER — Telehealth (INDEPENDENT_AMBULATORY_CARE_PROVIDER_SITE_OTHER): Payer: Self-pay | Admitting: *Deleted

## 2017-11-26 DIAGNOSIS — R109 Unspecified abdominal pain: Secondary | ICD-10-CM

## 2017-11-26 DIAGNOSIS — R634 Abnormal weight loss: Secondary | ICD-10-CM

## 2017-11-26 NOTE — Telephone Encounter (Signed)
Patient needs suprep 

## 2017-11-30 MED ORDER — SUPREP BOWEL PREP KIT 17.5-3.13-1.6 GM/177ML PO SOLN: 1.0000 | Freq: Once | ORAL | 0 refills | Status: AC

## 2017-12-02 IMAGING — US US SOFT TISSUE HEAD/NECK
1 series · 12 of 25 positions shown · non-contrast
Comparison: Thyroid ultrasound - 10/29/2008

CLINICAL DATA: Goiter.

EXAM:
THYROID ULTRASOUND
TECHNIQUE: Ultrasound examination of the thyroid gland and adjacent soft
tissues was performed.

[Series 1: us soft tissue head/neck · 0.06mm/px · 12 of 75 slices shown]
[im 4/75]
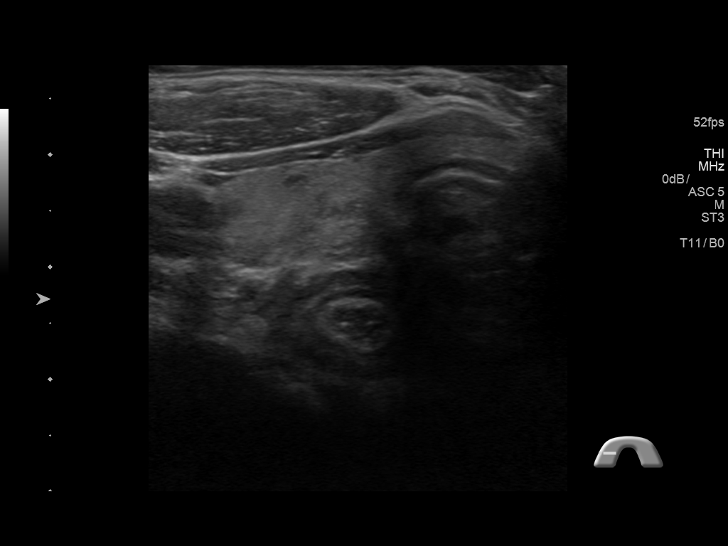
[im 10/75]
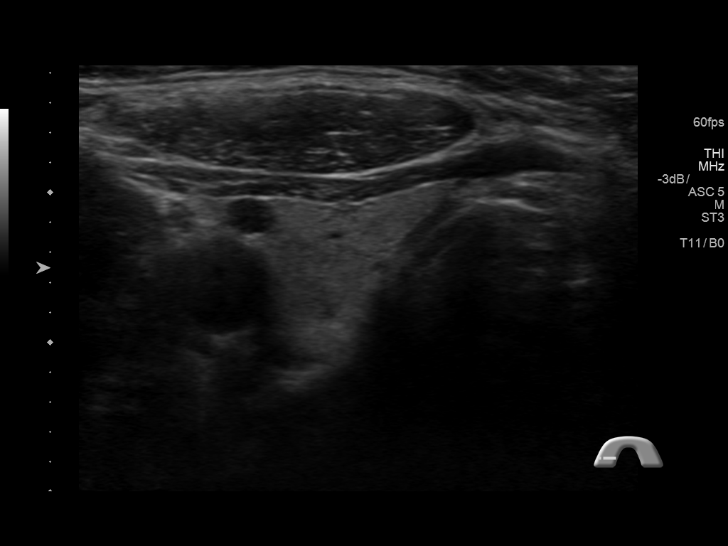
[im 16/75]
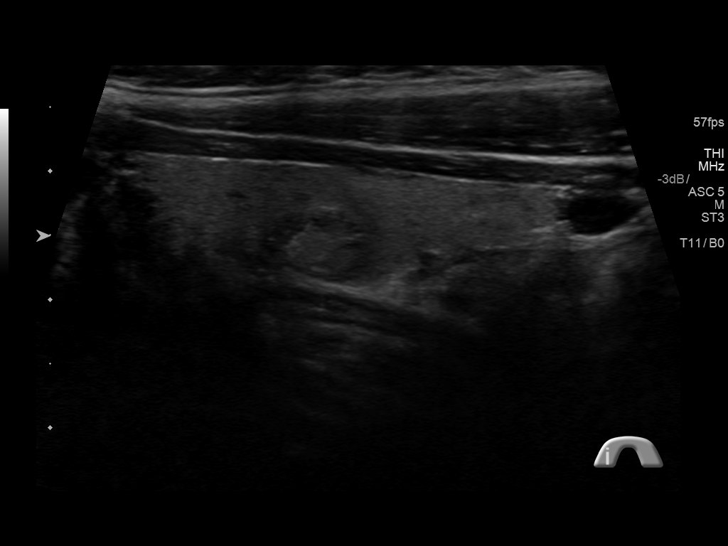
[im 22/75]
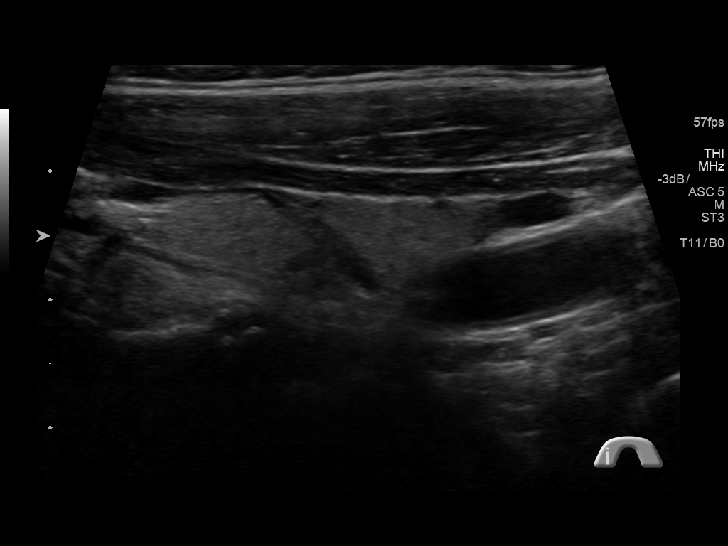
[im 28/75]
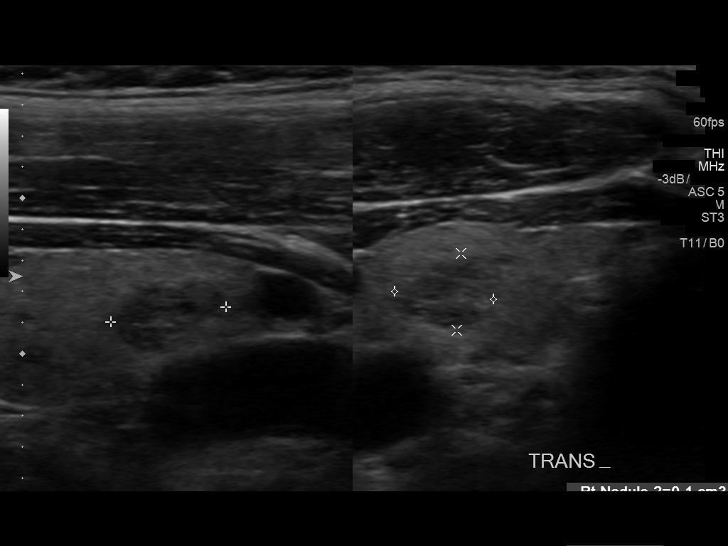
[im 34/75]
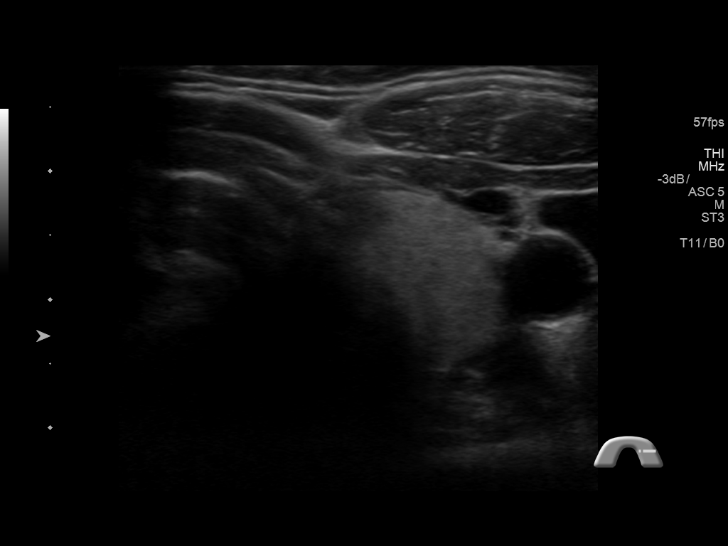
[im 41/75]
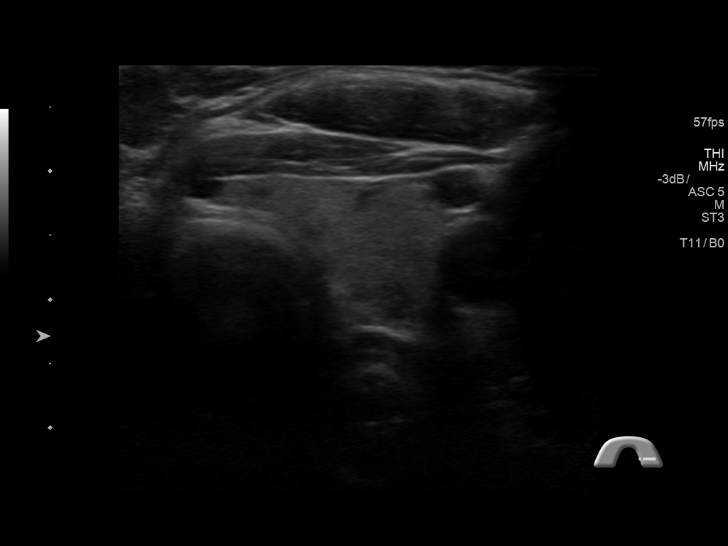
[im 47/75]
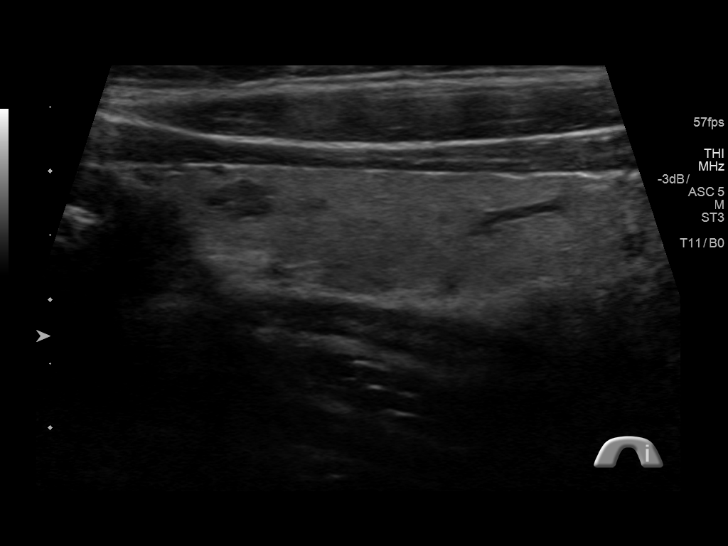
[im 53/75]
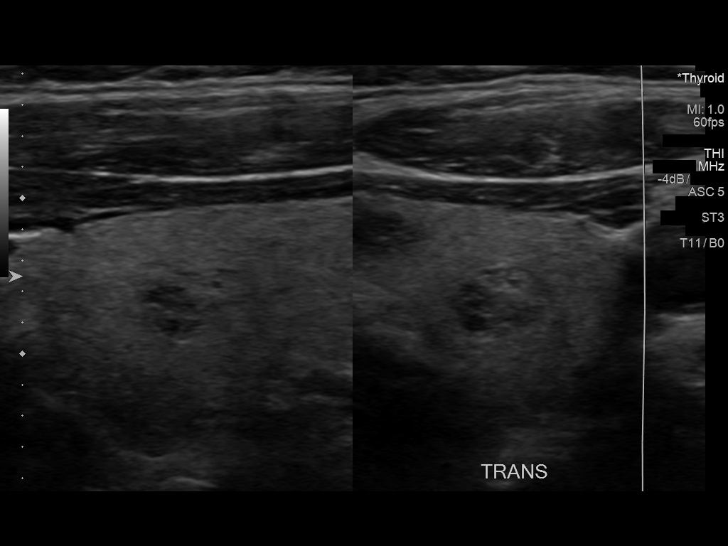
[im 59/75]
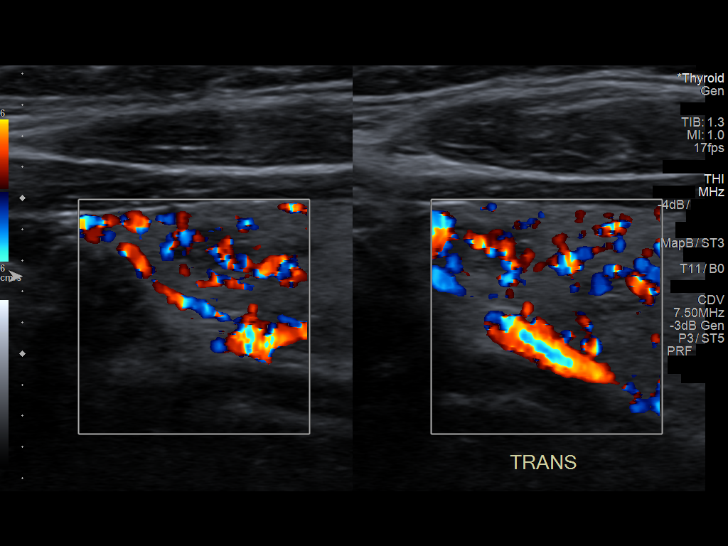
[im 65/75]
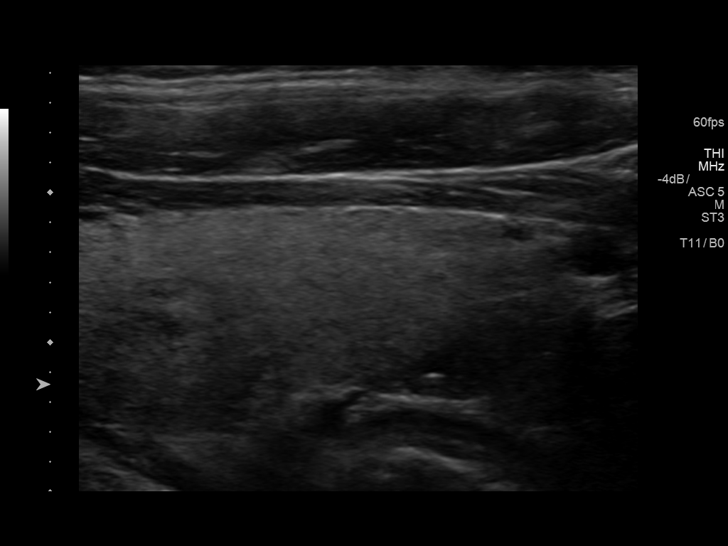
[im 71/75]
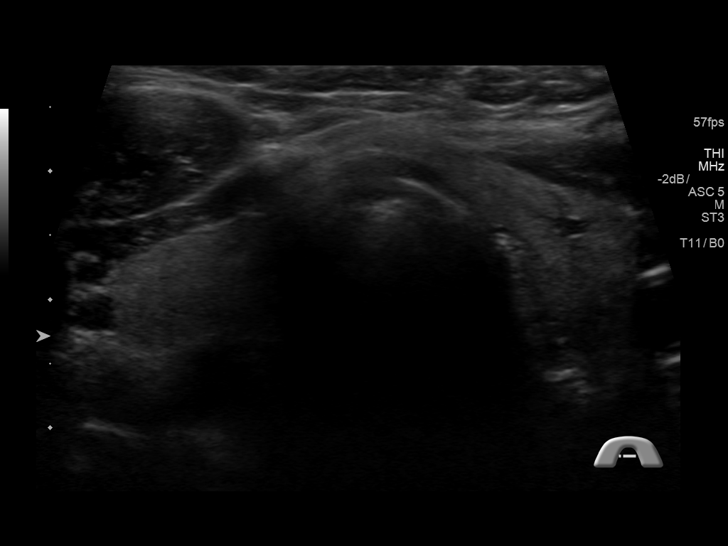

[12 of 25 positions shown; findings below may reference images not displayed]

FINDINGS: Parenchymal Echotexture: Mildly heterogenous

Estimated total number of nodules >/= 1 cm: 1

Number of spongiform nodules >/=  2 cm not described below (TR1): 0

Number of mixed cystic and solid nodules >/= 1.5 cm not described
below (TR2): 0

_________________________________________________________

Isthmus: Normal in size measuring 0.3 cm in diameter, unchanged

No discrete nodules are identified within the thyroid isthmus.

_________________________________________________________

Right lobe: Normal in size measuring 4.1 x 1.1 x 1.6 cm, unchanged,
previously, 4.6 x 1.5 x 1.7 cm

Nodule # 1:

Prior biopsy: No

Location: Right; Mid

Size: 1.0 x 0.6 x 0.7 cm, grossly unchanged compared to the [DATE]
examination, previously, 0.7 x 0.9 x 0.6 cm

Composition: solid/almost completely solid (2)

Echogenicity: isoechoic (1)

Shape: not taller-than-wide (0)

Margins: ill-defined (0)

Echogenic foci: none (0)

ACR TI-RADS total points: 3.

ACR TI-RADS risk category: TR3 (3 points).

Change in features: No

Change in ACR TI-RADS risk category: No

ACR TI-RADS recommendations:

Given size (<1.4 cm) and appearance, this nodule does NOT meet
TI-RADS criteria for biopsy or dedicated follow-up.

Note is made of an additional punctate (approximately 0.8 x 0.5 x
0.6 cm ill-defined nodule/pseudo nodule within the inferior aspect
the right lobe of the thyroid which is not meet imaging criteria to
recommend percutaneous sampling or dedicated follow-up.

_________________________________________________________

Left lobe: Normal in size measuring 4.6 x 1.8 x 1.6 cm, unchanged,
previously, 4.7 x 1.3 x 1.6 cm

Nodule # 1:

Prior biopsy: No

Location: Left; Mid

Size: 0.9 x 0.5 x 0.7 cm, minimally increased in size since remote
examination performed [DATE], previously, 0.6 x 0.5 x 0.4 cm

Composition: solid/almost completely solid (2)

Echogenicity: isoechoic (1)

Shape: not taller-than-wide (0)

Margins: ill-defined (0)

Echogenic foci: none (0)

ACR TI-RADS total points: 3.

ACR TI-RADS risk category: TR3 (3 points).

Change in features: Yes - the nodule has minimally increased in size
compared to the 7333 examination, currently 0.9 cm, previously,
cm, likely attributable to partial interval cystic degeneration.

Change in ACR TI-RADS risk category: No

ACR TI-RADS recommendations:

Given size (<1.4 cm) and appearance, this nodule does NOT meet
TI-RADS criteria for biopsy or dedicated follow-up.

Note is made of an additional punctate (sub 1 cm) nodules within the
left lobe of the thyroid, neither of which meet imaging criteria to
recommend percutaneous sampling or dedicated follow-up.
IMPRESSION: Findings suggestive of multi nodular goiter. None of the discretely
measured thyroid nodules meet imaging criteria to recommend
percutaneous sampling or dedicated follow-up.

The above is in keeping with the ACR TI-RADS recommendations - [HOSPITAL] 3438;[DATE].

## 2017-12-09 ENCOUNTER — Ambulatory Visit (HOSPITAL_COMMUNITY)
Admission: RE | Admit: 2017-12-09 | Discharge: 2017-12-09 | Disposition: A | Discharge status: Home / Self Care | Payer: PPO | Source: Ambulatory Visit | Attending: Pulmonary Disease | Admitting: Pulmonary Disease

## 2017-12-09 DIAGNOSIS — K449 Diaphragmatic hernia without obstruction or gangrene: Secondary | ICD-10-CM | POA: Insufficient documentation

## 2017-12-09 DIAGNOSIS — R109 Unspecified abdominal pain: Secondary | ICD-10-CM | POA: Insufficient documentation

## 2017-12-09 DIAGNOSIS — N200 Calculus of kidney: Secondary | ICD-10-CM | POA: Insufficient documentation

## 2017-12-09 DIAGNOSIS — R634 Abnormal weight loss: Secondary | ICD-10-CM | POA: Diagnosis not present

## 2017-12-09 MED ORDER — IOPAMIDOL (ISOVUE-300) INJECTION 61%
100.0000 mL | Freq: Once | INTRAVENOUS | Status: AC | PRN
  Administered 2017-12-09: 100 mL via INTRAVENOUS

## 2017-12-22 NOTE — Patient Instructions (Signed)
Teresa [name]  12/22/2017     @PREFPERIOPPHARMACY @   Your procedure is scheduled on  12/31/2017 .  Report to [facility] at  615   A.M.  Call this number if you have problems the morning of surgery:  [phone]   Remember:  Do not eat or drink after midnight.  You may drink clear liquids until (follow the instructions given to you) .  Clear liquids allowed are:                    Water, Juice (non-citric and without pulp), Carbonated beverages, Clear Tea, Black Coffee only, Plain Jell-O only, Gatorade and Plain Popsicles only    Take these medicines the morning of surgery with A SIP OF WATER wellbutrin, cymbalta, neurontin, ritalin, protonix.    Do not wear jewelry, make-up or nail polish.  Do not wear lotions, powders, or perfumes, or deodorant.  Do not shave 48 hours prior to surgery.  Men may shave face and neck.  Do not bring valuables to the hospital.  [facility] is not responsible for any belongings or valuables.  Contacts, dentures or bridgework may not be worn into surgery.  Leave your suitcase in the car.  After surgery it may be brought to your room.  For patients admitted to the hospital, discharge time will be determined by your treatment team.  Patients discharged the day of surgery will not be allowed to drive home.   Name and phone number of your driver:   family Special instructions:  Follow the diet and prep instructions given to you by Dr Olevia Perches office.  Please read over the following fact sheets that you were given. Anesthesia Post-op Instructions and Care and Recovery After Surgery       Colonoscopy, Adult A colonoscopy is an exam to look at the large intestine. It is done to check for problems, such as:  Lumps (tumors).  Growths (polyps).  Swelling (inflammation).  Bleeding.  What happens before the procedure? Eating and drinking Follow instructions from your doctor about eating and drinking. These instructions may  include:  A few days before the procedure - follow a low-fiber diet. ? Avoid nuts. ? Avoid seeds. ? Avoid dried fruit. ? Avoid raw fruits. ? Avoid vegetables.  1-3 days before the procedure - follow a clear liquid diet. Avoid liquids that have red or purple dye. Drink only clear liquids, such as: ? Clear broth or bouillon. ? Black coffee or tea. ? Clear juice. ? Clear soft drinks or sports drinks. ? Gelatin dessert. ? Popsicles.  On the day of the procedure - do not eat or drink anything during the 2 hours before the procedure.  Bowel prep If you were prescribed an oral bowel prep:  Take it as told by your doctor. Starting the day before your procedure, you will need to drink a lot of liquid. The liquid will cause you to poop (have bowel movements) until your poop is almost clear or light green.  If your skin or butt gets irritated from diarrhea, you may: ? Wipe the area with wipes that have medicine in them, such as adult wet wipes with aloe and vitamin E. ? Put something on your skin that soothes the area, such as petroleum jelly.  If you throw up (vomit) while drinking the bowel prep, take a break for up to 60 minutes. Then begin the bowel prep again. If you keep throwing up and  you cannot take the bowel prep without throwing up, call your doctor.  General instructions  Ask your doctor about changing or stopping your normal medicines. This is important if you take diabetes medicines or blood thinners.  Plan to have someone take you home from the hospital or clinic. What happens during the procedure?  An IV tube may be put into one of your veins.  You will be given medicine to help you relax (sedative).  To reduce your risk of infection: ? Your doctors will wash their hands. ? Your anal area will be washed with soap.  You will be asked to lie on your side with your knees bent.  Your doctor will get a long, thin, flexible tube ready. The tube will have a camera and a  light on the end.  The tube will be put into your anus.  The tube will be gently put into your large intestine.  Air will be delivered into your large intestine to keep it open. You may feel some pressure or cramping.  The camera will be used to take photos.  A small tissue sample may be removed from your body to be looked at under a microscope (biopsy). If any possible problems are found, the tissue will be sent to a lab for testing.  If small growths are found, your doctor may remove them and have them checked for cancer.  The tube that was put into your anus will be slowly removed. The procedure may vary among doctors and hospitals. What happens after the procedure?  Your doctor will check on you often until the medicines you were given have worn off.  Do not drive for 24 hours after the procedure.  You may have a small amount of blood in your poop.  You may pass gas.  You may have mild cramps or bloating in your belly (abdomen).  It is up to you to get the results of your procedure. Ask your doctor, or the department performing the procedure, when your results will be ready. This information is not intended to replace advice given to you by your health care provider. Make sure you discuss any questions you have with your health care provider. Document Released: 05/02/2010 Document Revised: 01/29/2016 Document Reviewed: 06/11/2015 Elsevier Interactive Patient Education  2017 [employer]  Colonoscopy, Adult, Care After This sheet gives you information about how to care for yourself after your procedure. Your health care provider may also give you more specific instructions. If you have problems or questions, contact your health care provider. What can I expect after the procedure? After the procedure, it is common to have:  A small amount of blood in your stool for 24 hours after the procedure.  Some gas.  Mild abdominal cramping or bloating.  Follow these  instructions at home: General instructions   For the first 24 hours after the procedure: ? Do not drive or use machinery. ? Do not sign important documents. ? Do not drink alcohol. ? Do your regular daily activities at a slower pace than normal. ? Eat soft, easy-to-digest foods. ? Rest often.  Take over-the-counter or prescription medicines only as told by your health care provider.  It is up to you to get the results of your procedure. Ask your health care provider, or the department performing the procedure, when your results will be ready. Relieving cramping and bloating  Try walking around when you have cramps or feel bloated.  Apply heat to your abdomen as told  by your health care provider. Use a heat source that your health care provider recommends, such as a moist heat pack or a heating pad. ? Place a towel between your skin and the heat source. ? Leave the heat on for 20-30 minutes. ? Remove the heat if your skin turns bright red. This is especially important if you are unable to feel pain, heat, or cold. You may have a greater risk of getting burned. Eating and drinking  Drink enough fluid to keep your urine clear or pale yellow.  Resume your normal diet as instructed by your health care provider. Avoid heavy or fried foods that are hard to digest.  Avoid drinking alcohol for as long as instructed by your health care provider. Contact a health care provider if:  You have blood in your stool 2-3 days after the procedure. Get help right away if:  You have more than a small spotting of blood in your stool.  You pass large blood clots in your stool.  Your abdomen is swollen.  You have nausea or vomiting.  You have a fever.  You have increasing abdominal pain that is not relieved with medicine. This information is not intended to replace advice given to you by your health care provider. Make sure you discuss any questions you have with your health care  provider. Document Released: 11/12/2003 Document Revised: 12/23/2015 Document Reviewed: 06/11/2015 Elsevier Interactive Patient Education  2018 [place] Anesthesia is a term that refers to techniques, procedures, and medicines that help a person stay safe and comfortable during a medical procedure. Monitored anesthesia care, or sedation, is one type of anesthesia. Your anesthesia specialist may recommend sedation if you will be having a procedure that does not require you to be unconscious, such as:  Cataract surgery.  A dental procedure.  A biopsy.  A colonoscopy.  During the procedure, you may receive a medicine to help you relax (sedative). There are three levels of sedation:  Mild sedation. At this level, you may feel awake and relaxed. You will be able to follow directions.  Moderate sedation. At this level, you will be sleepy. You may not remember the procedure.  Deep sedation. At this level, you will be asleep. You will not remember the procedure.  The more medicine you are given, the deeper your level of sedation will be. Depending on how you respond to the procedure, the anesthesia specialist may change your level of sedation or the type of anesthesia to fit your needs. An anesthesia specialist will monitor you closely during the procedure. Let your health care provider know about:  Any allergies you have.  All medicines you are taking, including vitamins, herbs, eye drops, creams, and over-the-counter medicines.  Any use of steroids (by mouth or as a cream).  Any problems you or family members have had with sedatives and anesthetic medicines.  Any blood disorders you have.  Any surgeries you have had.  Any medical conditions you have, such as sleep apnea.  Whether you are pregnant or may be pregnant.  Any use of cigarettes, alcohol, or street drugs. What are the risks? Generally, this is a safe procedure. However, problems may  occur, including:  Getting too much medicine (oversedation).  Nausea.  Allergic reaction to medicines.  Trouble breathing. If this happens, a breathing tube may be used to help with breathing. It will be removed when you are awake and breathing on your own.  Heart trouble.  Lung trouble.  Before the procedure Staying hydrated Follow instructions from your health care provider about hydration, which may include:  Up to 2 hours before the procedure - you may continue to drink clear liquids, such as water, clear fruit juice, black coffee, and plain tea.  Eating and drinking restrictions Follow instructions from your health care provider about eating and drinking, which may include:  8 hours before the procedure - stop eating heavy meals or foods such as meat, fried foods, or fatty foods.  6 hours before the procedure - stop eating light meals or foods, such as toast or cereal.  6 hours before the procedure - stop drinking milk or drinks that contain milk.  2 hours before the procedure - stop drinking clear liquids.  Medicines Ask your health care provider about:  Changing or stopping your regular medicines. This is especially important if you are taking diabetes medicines or blood thinners.  Taking medicines such as aspirin and ibuprofen. These medicines can thin your blood. Do not take these medicines before your procedure if your health care provider instructs you not to.  Tests and exams  You will have a physical exam.  You may have blood tests done to show: ? How well your kidneys and liver are working. ? How well your blood can clot.  General instructions  Plan to have someone take you home from the hospital or clinic.  If you will be going home right after the procedure, plan to have someone with you for 24 hours.  What happens during the procedure?  Your blood pressure, heart rate, breathing, level of pain and overall condition will be monitored.  An IV  tube will be inserted into one of your veins.  Your anesthesia specialist will give you medicines as needed to keep you comfortable during the procedure. This may mean changing the level of sedation.  The procedure will be performed. After the procedure  Your blood pressure, heart rate, breathing rate, and blood oxygen level will be monitored until the medicines you were given have worn off.  Do not drive for 24 hours if you received a sedative.  You may: ? Feel sleepy, clumsy, or nauseous. ? Feel forgetful about what happened after the procedure. ? Have a sore throat if you had a breathing tube during the procedure. ? Vomit. This information is not intended to replace advice given to you by your health care provider. Make sure you discuss any questions you have with your health care provider. Document Released: 12/24/2004 Document Revised: 09/06/2015 Document Reviewed: 07/21/2015 Elsevier Interactive Patient Education  2018 [place], Care After These instructions provide you with information about caring for yourself after your procedure. Your health care provider may also give you more specific instructions. Your treatment has been planned according to current medical practices, but problems sometimes occur. Call your health care provider if you have any problems or questions after your procedure. What can I expect after the procedure? After your procedure, it is common to:  Feel sleepy for several hours.  Feel clumsy and have poor balance for several hours.  Feel forgetful about what happened after the procedure.  Have poor judgment for several hours.  Feel nauseous or vomit.  Have a sore throat if you had a breathing tube during the procedure.  Follow these instructions at home: For at least 24 hours after the procedure:   Do not: ? Participate in activities in which you could fall or become injured. ? Drive. ?  Use heavy  machinery. ? Drink alcohol. ? Take sleeping pills or medicines that cause drowsiness. ? Make important decisions or sign legal documents. ? Take care of children on your own.  Rest. Eating and drinking  Follow the diet that is recommended by your health care provider.  If you vomit, drink water, juice, or soup when you can drink without vomiting.  Make sure you have little or no nausea before eating solid foods. General instructions  Have a responsible adult stay with you until you are awake and alert.  Take over-the-counter and prescription medicines only as told by your health care provider.  If you smoke, do not smoke without supervision.  Keep all follow-up visits as told by your health care provider. This is important. Contact a health care provider if:  You keep feeling nauseous or you keep vomiting.  You feel light-headed.  You develop a rash.  You have a fever. Get help right away if:  You have trouble breathing. This information is not intended to replace advice given to you by your health care provider. Make sure you discuss any questions you have with your health care provider. Document Released: 07/21/2015 Document Revised: 11/20/2015 Document Reviewed: 07/21/2015 Elsevier Interactive Patient Education  [employer].

## 2017-12-24 ENCOUNTER — Encounter (HOSPITAL_COMMUNITY)
Admission: RE | Admit: 2017-12-24 | Discharge: 2017-12-24 | Disposition: A | Discharge status: Home / Self Care | Payer: PPO | Source: Ambulatory Visit | Attending: Internal Medicine | Admitting: Internal Medicine

## 2017-12-24 ENCOUNTER — Other Ambulatory Visit: Payer: Self-pay

## 2017-12-24 ENCOUNTER — Encounter (HOSPITAL_COMMUNITY): Payer: Self-pay

## 2017-12-24 DIAGNOSIS — Z1211 Encounter for screening for malignant neoplasm of colon: Secondary | ICD-10-CM | POA: Insufficient documentation

## 2017-12-24 DIAGNOSIS — Z01818 Encounter for other preprocedural examination: Secondary | ICD-10-CM | POA: Insufficient documentation

## 2017-12-24 LAB — BASIC METABOLIC PANEL
Anion gap: 9 (ref 5–15)
BUN: 20 mg/dL (ref 8–23)
CHLORIDE: 105 mmol/L (ref 98–111)
CO2: 24 mmol/L (ref 22–32)
CREATININE: 0.88 mg/dL (ref 0.44–1.00)
Calcium: 9.2 mg/dL (ref 8.9–10.3)
GFR calc non Af Amer: >60 mL/min (ref >60)
GLUCOSE: 113 mg/dL — AB (ref 70–99)
Potassium: 4.1 mmol/L (ref 3.5–5.1)
Sodium: 138 mmol/L (ref 135–145)

## 2017-12-24 LAB — CBC WITH DIFFERENTIAL/PLATELET
Basophils Absolute: 0.1 10*3/uL (ref 0.0–0.1)
Basophils Relative: 1 %
Eosinophils Absolute: 0.1 10*3/uL (ref 0.0–0.7)
Eosinophils Relative: 1 %
HEMATOCRIT: 39.4 % (ref 36.0–46.0)
HEMOGLOBIN: 13 g/dL (ref 12.0–15.0)
Lymphocytes Relative: 28 %
Lymphs Abs: 1.8 10*3/uL (ref 0.7–4.0)
MCH: 30.4 pg (ref 26.0–34.0)
MCHC: 33 g/dL (ref 30.0–36.0)
MCV: 92.1 fL (ref 78.0–100.0)
MONOS PCT: 8 %
Monocytes Absolute: 0.5 10*3/uL (ref 0.1–1.0)
NEUTROS ABS: 4.1 10*3/uL (ref 1.7–7.7)
NEUTROS PCT: 62 %
Platelets: 213 10*3/uL (ref 150–400)
RBC: 4.28 MIL/uL (ref 3.87–5.11)
RDW: 12.6 % (ref 11.5–15.5)
WBC: 6.5 10*3/uL (ref 4.0–10.5)

## 2017-12-31 ENCOUNTER — Ambulatory Visit (HOSPITAL_COMMUNITY): Payer: PPO | Admitting: Anesthesiology

## 2017-12-31 ENCOUNTER — Ambulatory Visit (HOSPITAL_COMMUNITY)
Admission: RE | Admit: 2017-12-31 | Discharge: 2017-12-31 | Disposition: A | Discharge status: Home / Self Care | Payer: PPO | Source: Ambulatory Visit | Attending: Internal Medicine | Admitting: Internal Medicine

## 2017-12-31 ENCOUNTER — Encounter (HOSPITAL_COMMUNITY): Payer: Self-pay | Admitting: *Deleted

## 2017-12-31 ENCOUNTER — Encounter (HOSPITAL_COMMUNITY)
Admission: RE | Disposition: A | Discharge status: Home / Self Care | Payer: Self-pay | Source: Ambulatory Visit | Attending: Internal Medicine

## 2017-12-31 DIAGNOSIS — Z1211 Encounter for screening for malignant neoplasm of colon: Secondary | ICD-10-CM | POA: Diagnosis not present

## 2017-12-31 DIAGNOSIS — G709 Myoneural disorder, unspecified: Secondary | ICD-10-CM | POA: Insufficient documentation

## 2017-12-31 DIAGNOSIS — Z8261 Family history of arthritis: Secondary | ICD-10-CM | POA: Insufficient documentation

## 2017-12-31 DIAGNOSIS — Z8262 Family history of osteoporosis: Secondary | ICD-10-CM | POA: Insufficient documentation

## 2017-12-31 DIAGNOSIS — Z91040 Latex allergy status: Secondary | ICD-10-CM | POA: Diagnosis not present

## 2017-12-31 DIAGNOSIS — Z79899 Other long term (current) drug therapy: Secondary | ICD-10-CM | POA: Insufficient documentation

## 2017-12-31 DIAGNOSIS — M1990 Unspecified osteoarthritis, unspecified site: Secondary | ICD-10-CM | POA: Insufficient documentation

## 2017-12-31 DIAGNOSIS — K644 Residual hemorrhoidal skin tags: Secondary | ICD-10-CM | POA: Diagnosis not present

## 2017-12-31 DIAGNOSIS — Z823 Family history of stroke: Secondary | ICD-10-CM | POA: Diagnosis not present

## 2017-12-31 DIAGNOSIS — Z8249 Family history of ischemic heart disease and other diseases of the circulatory system: Secondary | ICD-10-CM | POA: Diagnosis not present

## 2017-12-31 DIAGNOSIS — M797 Fibromyalgia: Secondary | ICD-10-CM | POA: Insufficient documentation

## 2017-12-31 DIAGNOSIS — Z8379 Family history of other diseases of the digestive system: Secondary | ICD-10-CM | POA: Diagnosis not present

## 2017-12-31 DIAGNOSIS — Z833 Family history of diabetes mellitus: Secondary | ICD-10-CM | POA: Diagnosis not present

## 2017-12-31 DIAGNOSIS — Z91018 Allergy to other foods: Secondary | ICD-10-CM | POA: Insufficient documentation

## 2017-12-31 DIAGNOSIS — Z811 Family history of alcohol abuse and dependence: Secondary | ICD-10-CM | POA: Diagnosis not present

## 2017-12-31 DIAGNOSIS — E739 Lactose intolerance, unspecified: Secondary | ICD-10-CM | POA: Insufficient documentation

## 2017-12-31 DIAGNOSIS — D123 Benign neoplasm of transverse colon: Secondary | ICD-10-CM | POA: Diagnosis not present

## 2017-12-31 DIAGNOSIS — Z818 Family history of other mental and behavioral disorders: Secondary | ICD-10-CM | POA: Insufficient documentation

## 2017-12-31 DIAGNOSIS — K219 Gastro-esophageal reflux disease without esophagitis: Secondary | ICD-10-CM | POA: Insufficient documentation

## 2017-12-31 DIAGNOSIS — F329 Major depressive disorder, single episode, unspecified: Secondary | ICD-10-CM | POA: Insufficient documentation

## 2017-12-31 DIAGNOSIS — G4700 Insomnia, unspecified: Secondary | ICD-10-CM | POA: Insufficient documentation

## 2017-12-31 DIAGNOSIS — E7800 Pure hypercholesterolemia, unspecified: Secondary | ICD-10-CM | POA: Diagnosis not present

## 2017-12-31 DIAGNOSIS — Z832 Family history of diseases of the blood and blood-forming organs and certain disorders involving the immune mechanism: Secondary | ICD-10-CM | POA: Diagnosis not present

## 2017-12-31 HISTORY — PX: POLYPECTOMY: SHX5525

## 2017-12-31 HISTORY — PX: COLONOSCOPY WITH PROPOFOL: SHX5780

## 2017-12-31 SURGERY — COLONOSCOPY WITH PROPOFOL
Anesthesia: Monitor Anesthesia Care

## 2017-12-31 MED ORDER — CHLORHEXIDINE GLUCONATE CLOTH 2 % EX PADS: 6.0000 | MEDICATED_PAD | Freq: Once | CUTANEOUS | Status: DC

## 2017-12-31 MED ORDER — LACTATED RINGERS IV SOLN
INTRAVENOUS | Status: DC | PRN
  Administered 2017-12-31: 07:00:00 via INTRAVENOUS

## 2017-12-31 MED ORDER — MIDAZOLAM HCL 5 MG/5ML IJ SOLN
INTRAMUSCULAR | Status: DC | PRN
  Administered 2017-12-31: 2 mg via INTRAVENOUS

## 2017-12-31 MED ORDER — PROPOFOL 500 MG/50ML IV EMUL
INTRAVENOUS | Status: DC | PRN
  Administered 2017-12-31: 150 ug/kg/min via INTRAVENOUS

## 2017-12-31 MED ORDER — MIDAZOLAM HCL 2 MG/2ML IJ SOLN
INTRAMUSCULAR | Status: AC
  Filled 2017-12-31: qty 2

## 2017-12-31 MED ORDER — LACTATED RINGERS IV SOLN
INTRAVENOUS | Status: DC
  Administered 2017-12-31: 1000 mL via INTRAVENOUS

## 2017-12-31 MED ORDER — STERILE WATER FOR IRRIGATION IR SOLN
Status: DC | PRN
  Administered 2017-12-31: 1.5 mL

## 2017-12-31 NOTE — Op Note (Signed)
[facility] Patient Name: Teresa [name] Procedure Date: 12/31/2017 7:07 AM MRN: [id number] Date of Birth: 1950/08/24 Attending MD: Hildred Laser , MD CSN: [id number] Age: 67 Admit Type: Outpatient Procedure:                Colonoscopy Indications:              Screening for colorectal malignant neoplasm Providers:                Hildred Laser, MD, Charlsie Quest. Theda Sers RN, RN, Aram Candela Referring MD:             Jasper Loser. Luan Pulling, MD Medicines:                Propofol per Anesthesia Complications:            No immediate complications. Estimated Blood Loss:     Estimated blood loss was minimal. Procedure:                Pre-Anesthesia Assessment:                           - Prior to the procedure, a History and Physical                            was performed, and patient medications and                            allergies were reviewed. The patient's tolerance of                            previous anesthesia was also reviewed. The risks                            and benefits of the procedure and the sedation                            options and risks were discussed with the patient.                            All questions were answered, and informed consent                            was obtained. Prior Anticoagulants: The patient has                            taken no previous anticoagulant or antiplatelet                            agents. ASA Grade Assessment: II - A patient with                            mild systemic disease. After reviewing the risks  and benefits, the patient was deemed in                            satisfactory condition to undergo the procedure.                           After obtaining informed consent, the colonoscope                            was passed under direct vision. Throughout the                            procedure, the patient's blood pressure, pulse, and      oxygen saturations were monitored continuously. The                            CF-HQ190L ([id number]) scope was introduced through                            the anus and advanced to the the cecum, identified                            by appendiceal orifice and ileocecal valve. The                            colonoscopy was performed without difficulty. The                            patient tolerated the procedure well. The quality                            of the bowel preparation was excellent. The                            ileocecal valve, appendiceal orifice, and rectum                            were photographed. Scope In: 7:35:21 AM Scope Out: 7:54:13 AM Scope Withdrawal Time: 0 hours 8 minutes 16 seconds  Total Procedure Duration: 0 hours 18 minutes 52 seconds  Findings:      The perianal and digital rectal examinations were normal.      A small polyp was found in the proximal transverse colon. The polyp was       sessile. Biopsies were taken with a cold forceps for histology.      The exam was otherwise normal throughout the examined colon.      External hemorrhoids were found during retroflexion. The hemorrhoids       were small. Impression:               - One small polyp in the proximal transverse colon.                            Biopsied.                           -  External hemorrhoids. Moderate Sedation:      Per Anesthesia Care Recommendation:           - Patient has a contact number available for                            emergencies. The signs and symptoms of potential                            delayed complications were discussed with the                            patient. Return to normal activities tomorrow.                            Written discharge instructions were provided to the                            patient.                           - Resume previous diet today.                           - Continue present medications.                            - No aspirin, ibuprofen, naproxen, or other                            non-steroidal anti-inflammatory drugs for 1 day.                           - Await pathology results.                           - Repeat colonoscopy is recommended. The                            colonoscopy date will be determined after pathology                            results from today's exam become available for                            review. Procedure Code(s):        --- Professional ---                           [phone], Colonoscopy, flexible; with biopsy, single                            or multiple Diagnosis Code(s):        --- Professional ---                           Z12.11, Encounter for screening for malignant  neoplasm of colon                           D12.3, Benign neoplasm of transverse colon (hepatic                            flexure or splenic flexure)                           K64.4, Residual hemorrhoidal skin tags CPT copyright 2017 American [employer] Association. All rights reserved. The codes documented in this report are preliminary and upon coder review may  be revised to meet current compliance requirements. Hildred Laser, MD Hildred Laser, MD 12/31/2017 8:01:28 AM This report has been signed electronically. Number of Addenda: 0

## 2017-12-31 NOTE — Discharge Instructions (Signed)
No aspirin or NSAIDs for 24 hours. Resume usual medications as before. Resume usual diet. No driving for 24 hours. Physician will call with biopsy results.   Colonoscopy, Adult, Care After This sheet gives you information about how to care for yourself after your procedure. Your health care provider may also give you more specific instructions. If you have problems or questions, contact your health care provider. What can I expect after the procedure? After the procedure, it is common to have:  A small amount of blood in your stool for 24 hours after the procedure.  Some gas.  Mild abdominal cramping or bloating.  Follow these instructions at home: General instructions   For the first 24 hours after the procedure: ? Do not drive or use machinery. ? Do not sign important documents. ? Do not drink alcohol. ? Do your regular daily activities at a slower pace than normal. ? Eat soft, easy-to-digest foods. ? Rest often.  Take over-the-counter or prescription medicines only as told by your health care provider.  It is up to you to get the results of your procedure. Ask your health care provider, or the department performing the procedure, when your results will be ready. Relieving cramping and bloating  Try walking around when you have cramps or feel bloated.  Apply heat to your abdomen as told by your health care provider. Use a heat source that your health care provider recommends, such as a moist heat pack or a heating pad. ? Place a towel between your skin and the heat source. ? Leave the heat on for 20-30 minutes. ? Remove the heat if your skin turns bright red. This is especially important if you are unable to feel pain, heat, or cold. You may have a greater risk of getting burned. Eating and drinking  Drink enough fluid to keep your urine clear or pale yellow.  Resume your normal diet as instructed by your health care provider. Avoid heavy or fried foods that are hard to  digest.  Avoid drinking alcohol for as long as instructed by your health care provider. Contact a health care provider if:  You have blood in your stool 2-3 days after the procedure. Get help right away if:  You have more than a small spotting of blood in your stool.  You pass large blood clots in your stool.  Your abdomen is swollen.  You have nausea or vomiting.  You have a fever.  You have increasing abdominal pain that is not relieved with medicine. This information is not intended to replace advice given to you by your health care provider. Make sure you discuss any questions you have with your health care provider. Document Released: 11/12/2003 Document Revised: 12/23/2015 Document Reviewed: 06/11/2015 Elsevier Interactive Patient Education  2018 [place] POST-ANESTHESIA  IMMEDIATELY FOLLOWING SURGERY:  Do not drive or operate machinery for the first twenty four hours after surgery.  Do not make any important decisions for twenty four hours after surgery or while taking narcotic pain medications or sedatives.  If you develop intractable nausea and vomiting or a severe headache please notify your doctor immediately.  FOLLOW-UP:  Please make an appointment with your surgeon as instructed. You do not need to follow up with anesthesia unless specifically instructed to do so.  WOUND CARE INSTRUCTIONS (if applicable):  Keep a dry clean dressing on the anesthesia/puncture wound site if there is drainage.  Once the wound has quit draining you may leave it open to  air.  Generally you should leave the bandage intact for twenty four hours unless there is drainage.  If the epidural site drains for more than 36-48 hours please call the anesthesia department.  QUESTIONS?:  Please feel free to call your physician or the hospital operator if you have any questions, and they will be happy to assist you.

## 2017-12-31 NOTE — Anesthesia Postprocedure Evaluation (Signed)
Anesthesia Post Note  Patient: Teresa [name]  Procedure(s) Performed: COLONOSCOPY WITH PROPOFOL (N/A ) POLYPECTOMY  Patient location during evaluation: PACU Anesthesia Type: MAC Level of consciousness: awake and alert Pain management: pain level controlled Vital Signs Assessment: post-procedure vital signs reviewed and stable Respiratory status: spontaneous breathing, nonlabored ventilation and respiratory function stable Cardiovascular status: blood pressure returned to baseline Postop Assessment: no apparent nausea or vomiting Anesthetic complications: no     Last Vitals:  Vitals:   12/31/17 0646 12/31/17 0805  BP: 129/75 (!) 95/47  Pulse:  72  Resp: 20 16  Temp: 36.7 C (P) 36.7 C  SpO2: 98% 100%    Last Pain:  Vitals:   12/31/17 0728  TempSrc:   PainSc: 0-No pain                 Giuliana Handyside J

## 2017-12-31 NOTE — Anesthesia Preprocedure Evaluation (Signed)
Anesthesia Evaluation  Patient identified by MRN, date of birth, ID band Patient awake    Reviewed: Allergy & Precautions, H&P , NPO status , Patient's Chart, lab work & pertinent test results, reviewed documented beta blocker date and time   Airway Mallampati: II  TM Distance: >3 FB Neck ROM: full   Comment: Decreased ROM Dental no notable dental hx.    Pulmonary neg pulmonary ROS, shortness of breath,    Pulmonary exam normal breath sounds clear to auscultation       Cardiovascular Exercise Tolerance: Good negative cardio ROS   Rhythm:regular Rate:Normal     Neuro/Psych  Headaches, PSYCHIATRIC DISORDERS Depression  Neuromuscular disease negative neurological ROS  negative psych ROS   GI/Hepatic negative GI ROS, Neg liver ROS, GERD  ,  Endo/Other  negative endocrine ROS  Renal/GU negative Renal ROS  negative genitourinary   Musculoskeletal   Abdominal   Peds  Hematology negative hematology ROS (+)   Anesthesia Other Findings   Reproductive/Obstetrics negative OB ROS                             Anesthesia Physical Anesthesia Plan  ASA: II  Anesthesia Plan: MAC   Post-op Pain Management:    Induction:   PONV Risk Score and Plan:   Airway Management Planned:   Additional Equipment:   Intra-op Plan:   Post-operative Plan:   Informed Consent: I have reviewed the patients History and Physical, chart, labs and discussed the procedure including the risks, benefits and alternatives for the proposed anesthesia with the patient or authorized representative who has indicated his/her understanding and acceptance.   Dental Advisory Given  Plan Discussed with: CRNA  Anesthesia Plan Comments:         Anesthesia Quick Evaluation

## 2017-12-31 NOTE — H&P (Signed)
Teresa [name] is an 67 y.o. female.   Chief Complaint: Patient is here for colonoscopy. HPI: Patient is 67 year old Caucasian female who is here for screening colonoscopy.  She denies abdominal pain change in bowel habits or rectal bleeding.  Last colonoscopy was in August 2007. She does not take aspirin or other anticoagulants. Family history is negative for CRC.  Past Medical History:  Diagnosis Date  . Arthritis   . Depression   . Disc disorder   . Fibromyalgia   . GERD (gastroesophageal reflux disease)   . High cholesterol   . Insomnia     Past Surgical History:  Procedure Laterality Date  . BUNIONECTOMY Bilateral   . FACIAL COSMETIC SURGERY    . TONSILLECTOMY      Family History  Problem Relation Age of Onset  . Diabetes Mother   . Stroke Mother   . Kidney disease Mother   . Depression Mother   . Heart disease Mother   . Hypertension Mother   . Clotting disorder Mother   . Arthritis Mother   . Osteoporosis Mother   . Ulcers Father   . GER disease Father   . Alcohol abuse Father   . Diabetes Sister   . Hypertension Sister   . Arthritis Sister   . Asthma Maternal Grandmother   . Colon cancer Neg Hx    Social History:  reports that she has never smoked. She has never used smokeless tobacco. She reports that she does not drink alcohol or use drugs.  Allergies:  Allergies  Allergen Reactions  . Meat Extract Anaphylaxis    Any meat at all BITTEN BY TICK-DEVELOPED MAMMAL MEAT ALLERGY   . Milk-Related Compounds Other (See Comments)    All Diary Products due to the [place] by Tick  . Lactose Intolerance (Gi) Other (See Comments)    Gi upset  . Latex Rash    Facility-Administered Medications Prior to Admission  Medication Dose Route Frequency Provider Last Rate Last Dose  . diclofenac sodium (VOLTAREN) 1 % transdermal gel 2 g  2 g Topical QID Carole Civil, MD       Medications Prior to Admission  Medication Sig Dispense Refill   . buPROPion (WELLBUTRIN SR) 150 MG 12 hr tablet Take 150 mg by mouth 2 (two) times daily.      . diclofenac sodium (VOLTAREN) 1 % GEL Apply 2 g topically 4 (four) times daily. (Patient taking differently: Apply 2 g topically 4 (four) times daily as needed (for pain.). ) 1 Tube 5  . DULoxetine (CYMBALTA) 60 MG capsule Take 60 mg by mouth daily.     . famotidine (PEPCID) 20 MG tablet Take 20 mg by mouth at bedtime.    . gabapentin (NEURONTIN) 300 MG capsule Take 300 mg by mouth 3 (three) times daily as needed (for nerve issues with tongue or tongue burning).     . methylphenidate (RITALIN) 20 MG tablet Take 20 mg by mouth 2 (two) times daily.    . Multiple Vitamins-Minerals (MULTIVITAMINS THER. W/MINERALS) TABS Take 1 tablet by mouth every morning. One-A-Day for Women 50+    . pantoprazole (PROTONIX) 40 MG tablet Take 1 tablet (40 mg total) by mouth 2 (two) times daily before a meal. (Patient taking differently: Take 40 mg by mouth daily before breakfast. ) 60 tablet 5  . pravastatin (PRAVACHOL) 80 MG tablet Take 80 mg by mouth at bedtime.    Marland Kitchen PREMARIN vaginal cream Place 1 Applicatorful  vaginally 3 (three) times a week.   6  . Simethicone (PHAZYME) 180 MG CAPS Take 1 capsule (180 mg total) by mouth 2 (two) times daily. (Patient taking differently: Take 180 mg by mouth 2 (two) times daily as needed (flatulence). )  0  . temazepam (RESTORIL) 22.5 MG capsule Take 22.5 mg by mouth at bedtime.    . traZODone (DESYREL) 50 MG tablet Take 25 mg by mouth at bedtime as needed.       No results found for this or any previous visit (from the past 48 hour(s)). No results found.  ROS  Blood pressure 129/75, temperature 98.1 F (36.7 C), temperature source Oral, resp. rate 20, SpO2 98 %. Physical Exam  Constitutional: She appears well-developed and well-nourished.  HENT:  Mouth/Throat: Oropharynx is clear and moist.  Eyes: Conjunctivae are normal. No scleral icterus.  Neck: No thyromegaly present.   Cardiovascular: Normal rate, regular rhythm and normal heart sounds.  No murmur heard. Respiratory: Effort normal and breath sounds normal.  GI: Soft. She exhibits no distension and no mass. There is no tenderness.  Musculoskeletal: She exhibits no edema.  Lymphadenopathy:    She has no cervical adenopathy.  Neurological: She is alert.  Skin: Skin is warm and dry.     Assessment/Plan Average risk screening colonoscopy.  Hildred Laser, MD 12/31/2017, 7:15 AM

## 2017-12-31 NOTE — Transfer of Care (Signed)
Immediate Anesthesia Transfer of Care Note  Patient: Teresa [name]  Procedure(s) Performed: COLONOSCOPY WITH PROPOFOL (N/A ) POLYPECTOMY  Patient Location: PACU  Anesthesia Type:MAC  Level of Consciousness: awake and patient cooperative  Airway & Oxygen Therapy: Patient Spontanous Breathing  Post-op Assessment: Report given to RN, Post -op Vital signs reviewed and stable and Patient moving all extremities  Post vital signs: Reviewed and stable  Last Vitals:  Vitals Value Taken Time  BP    Temp    Pulse    Resp    SpO2      Last Pain:  Vitals:   12/31/17 0728  TempSrc:   PainSc: 0-No pain      Patients Stated Pain Goal: 8 (27/78/24 2353)  Complications: No apparent anesthesia complications

## 2018-01-05 ENCOUNTER — Encounter (HOSPITAL_COMMUNITY): Payer: Self-pay | Admitting: Internal Medicine

## 2018-01-14 DIAGNOSIS — M791 Myalgia, unspecified site: Secondary | ICD-10-CM | POA: Diagnosis not present

## 2018-01-14 DIAGNOSIS — E785 Hyperlipidemia, unspecified: Secondary | ICD-10-CM | POA: Diagnosis not present

## 2018-01-14 DIAGNOSIS — F329 Major depressive disorder, single episode, unspecified: Secondary | ICD-10-CM | POA: Diagnosis not present

## 2018-01-14 DIAGNOSIS — M19012 Primary osteoarthritis, left shoulder: Secondary | ICD-10-CM | POA: Diagnosis not present

## 2018-01-14 DIAGNOSIS — E559 Vitamin D deficiency, unspecified: Secondary | ICD-10-CM | POA: Diagnosis not present

## 2018-01-14 DIAGNOSIS — G4701 Insomnia due to medical condition: Secondary | ICD-10-CM | POA: Diagnosis not present

## 2018-01-14 DIAGNOSIS — R739 Hyperglycemia, unspecified: Secondary | ICD-10-CM | POA: Diagnosis not present

## 2018-01-14 DIAGNOSIS — M25519 Pain in unspecified shoulder: Secondary | ICD-10-CM | POA: Diagnosis not present

## 2018-01-14 DIAGNOSIS — M5030 Other cervical disc degeneration, unspecified cervical region: Secondary | ICD-10-CM | POA: Diagnosis not present

## 2018-01-14 DIAGNOSIS — R634 Abnormal weight loss: Secondary | ICD-10-CM | POA: Diagnosis not present

## 2018-02-08 DIAGNOSIS — Z23 Encounter for immunization: Secondary | ICD-10-CM | POA: Diagnosis not present

## 2018-03-18 ENCOUNTER — Other Ambulatory Visit (INDEPENDENT_AMBULATORY_CARE_PROVIDER_SITE_OTHER): Payer: Self-pay | Admitting: Physical Medicine and Rehabilitation

## 2018-03-21 NOTE — Telephone Encounter (Signed)
Please advise 

## 2018-05-11 DIAGNOSIS — R739 Hyperglycemia, unspecified: Secondary | ICD-10-CM | POA: Diagnosis not present

## 2018-05-11 DIAGNOSIS — M19012 Primary osteoarthritis, left shoulder: Secondary | ICD-10-CM | POA: Diagnosis not present

## 2018-05-11 DIAGNOSIS — G4700 Insomnia, unspecified: Secondary | ICD-10-CM | POA: Diagnosis not present

## 2018-05-11 DIAGNOSIS — E049 Nontoxic goiter, unspecified: Secondary | ICD-10-CM | POA: Diagnosis not present

## 2018-05-11 DIAGNOSIS — M25519 Pain in unspecified shoulder: Secondary | ICD-10-CM | POA: Diagnosis not present

## 2018-05-11 DIAGNOSIS — F329 Major depressive disorder, single episode, unspecified: Secondary | ICD-10-CM | POA: Diagnosis not present

## 2018-05-11 DIAGNOSIS — M5030 Other cervical disc degeneration, unspecified cervical region: Secondary | ICD-10-CM | POA: Diagnosis not present

## 2018-05-11 DIAGNOSIS — R634 Abnormal weight loss: Secondary | ICD-10-CM | POA: Diagnosis not present

## 2018-05-11 DIAGNOSIS — M791 Myalgia, unspecified site: Secondary | ICD-10-CM | POA: Diagnosis not present

## 2018-05-11 DIAGNOSIS — E559 Vitamin D deficiency, unspecified: Secondary | ICD-10-CM | POA: Diagnosis not present

## 2018-05-11 DIAGNOSIS — K210 Gastro-esophageal reflux disease with esophagitis: Secondary | ICD-10-CM | POA: Diagnosis not present

## 2018-05-11 LAB — TSH
T3, Free: 3.5
T4,Free (Direct): 1.17
TSH: 2.16

## 2018-05-13 ENCOUNTER — Other Ambulatory Visit: Payer: Self-pay | Admitting: Pulmonary Disease

## 2018-05-13 DIAGNOSIS — M5030 Other cervical disc degeneration, unspecified cervical region: Secondary | ICD-10-CM | POA: Diagnosis not present

## 2018-05-13 DIAGNOSIS — M791 Myalgia, unspecified site: Secondary | ICD-10-CM | POA: Diagnosis not present

## 2018-05-13 DIAGNOSIS — G4700 Insomnia, unspecified: Secondary | ICD-10-CM | POA: Diagnosis not present

## 2018-05-13 DIAGNOSIS — E049 Nontoxic goiter, unspecified: Secondary | ICD-10-CM

## 2018-05-13 LAB — CBC WITH DIFF/PLATELET
BASO(ABSOLUTE): 0.1
Eosinophils Absolute: 0
HCT: 40 (ref 29–41)
Hemoglobin: 13.6
Immature Granulocytes: 1
Lymphocytes: 20
MCH: 30.2
MCHC: 34.2
MCV: 88 (ref 76–111)
Monocytes(Absolute): 0.6
Neutrophils: 66
RBC: 4.5 (ref 3.87–5.11)
RDW: 12.5
WBC: 6.1
platelet count: 176

## 2018-05-18 ENCOUNTER — Ambulatory Visit (HOSPITAL_COMMUNITY)
Admission: RE | Admit: 2018-05-18 | Discharge: 2018-05-18 | Disposition: A | Discharge status: Home / Self Care | Payer: PPO | Source: Ambulatory Visit | Attending: Pulmonary Disease | Admitting: Pulmonary Disease

## 2018-05-18 DIAGNOSIS — E049 Nontoxic goiter, unspecified: Secondary | ICD-10-CM | POA: Diagnosis not present

## 2018-10-03 IMAGING — MR MR CERVICAL SPINE W/O CM
4 of 5 series · 14 of 48 positions shown · non-contrast
Comparison: Multiple exams, including 02/29/2016 radiographs and
09/10/2015 CT scan

CLINICAL DATA: Neck pain with bilateral arm weakness and low back
pain for greater than 5 years.

EXAM:
MRI CERVICAL SPINE WITHOUT CONTRAST
TECHNIQUE: Multiplanar, multisequence MR imaging of the cervical spine was
performed. No intravenous contrast was administered.

[Series 4: T2 · sagittal · 3.0mm · 0.38mm/px · 5 of 13 slices shown (1 of 2)]
[im 1/13]
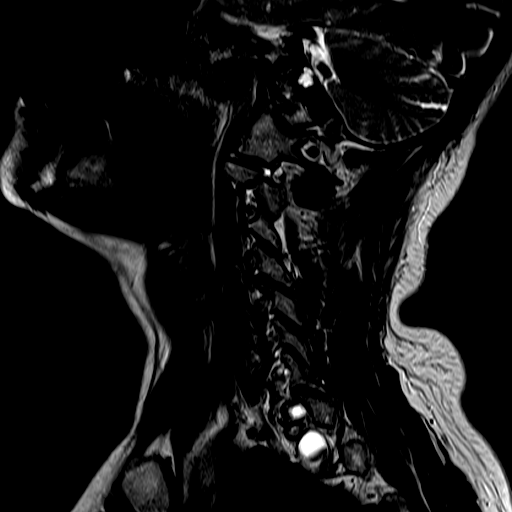
[im 3/13]
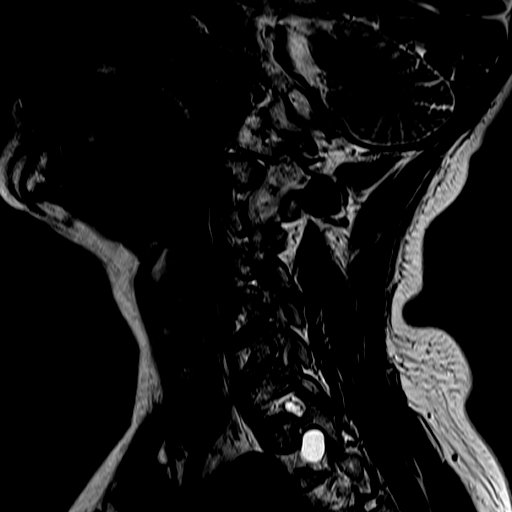
[im 5/13]
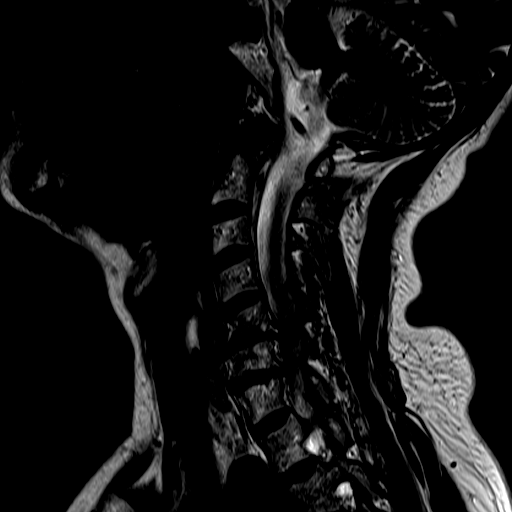
[im 8/13]
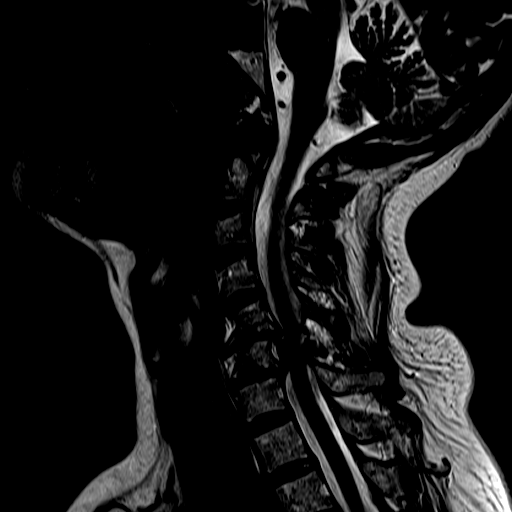
[im 13/13]
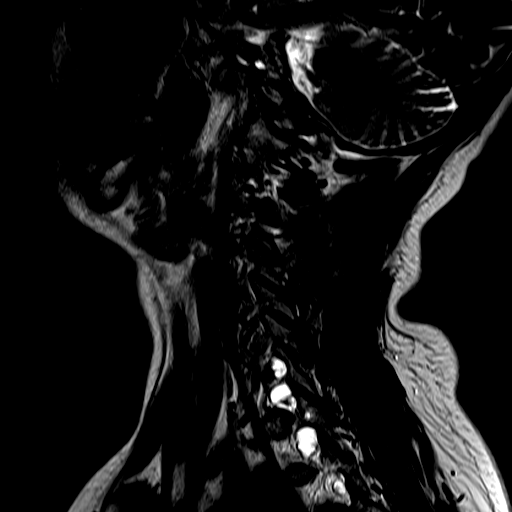

[Series 5: FLAIR · sagittal · 3.0mm · 0.44mm/px · 3 of 13 slices shown]
[im 3/13]
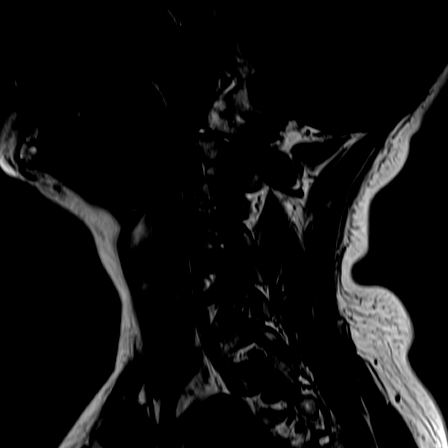
[im 8/13]
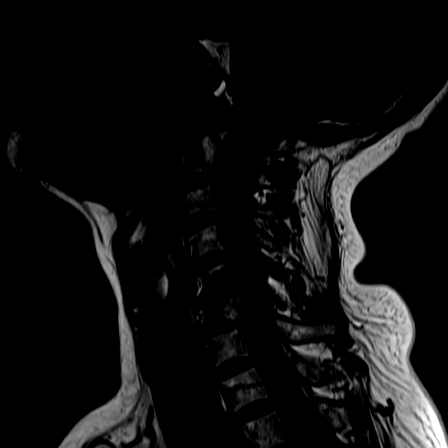
[im 13/13]
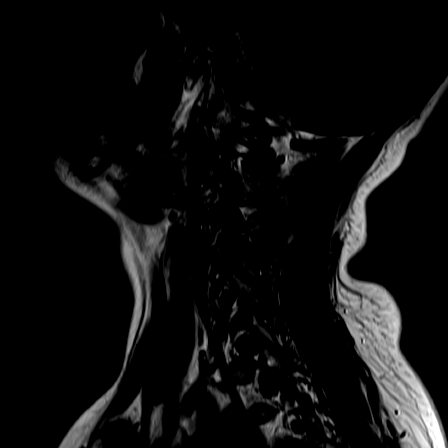

[Series 6: ir sagital · sagittal · 3.0mm · 0.22mm/px · 3 of 13 slices shown]
[im 3/13]
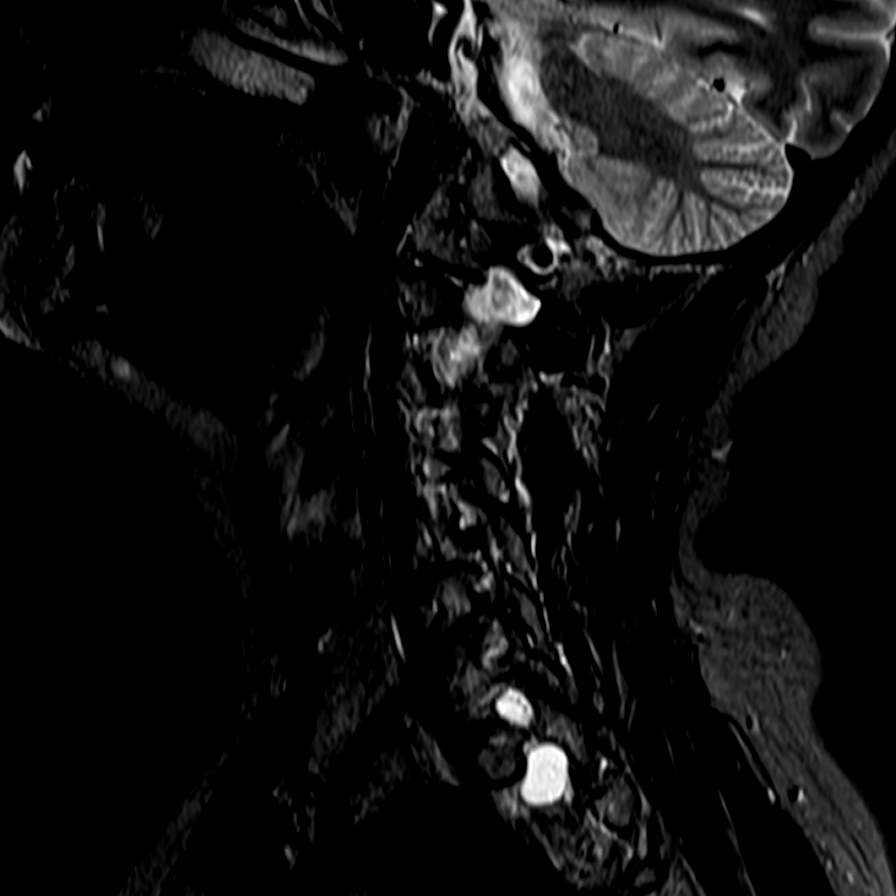
[im 8/13]
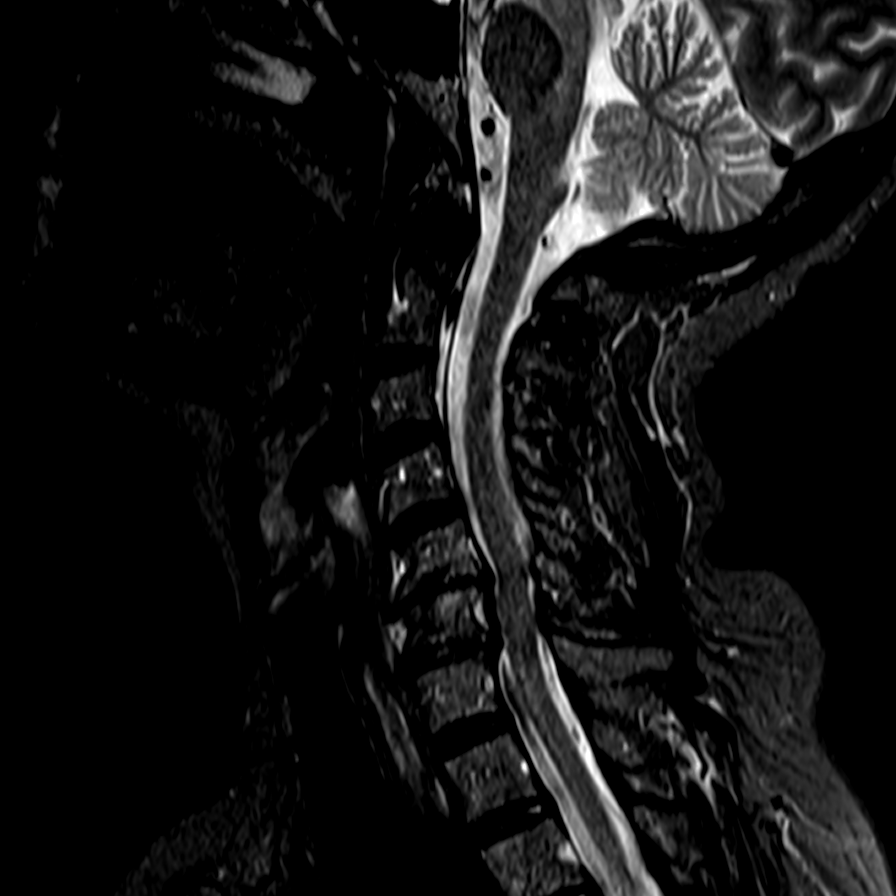
[im 13/13]
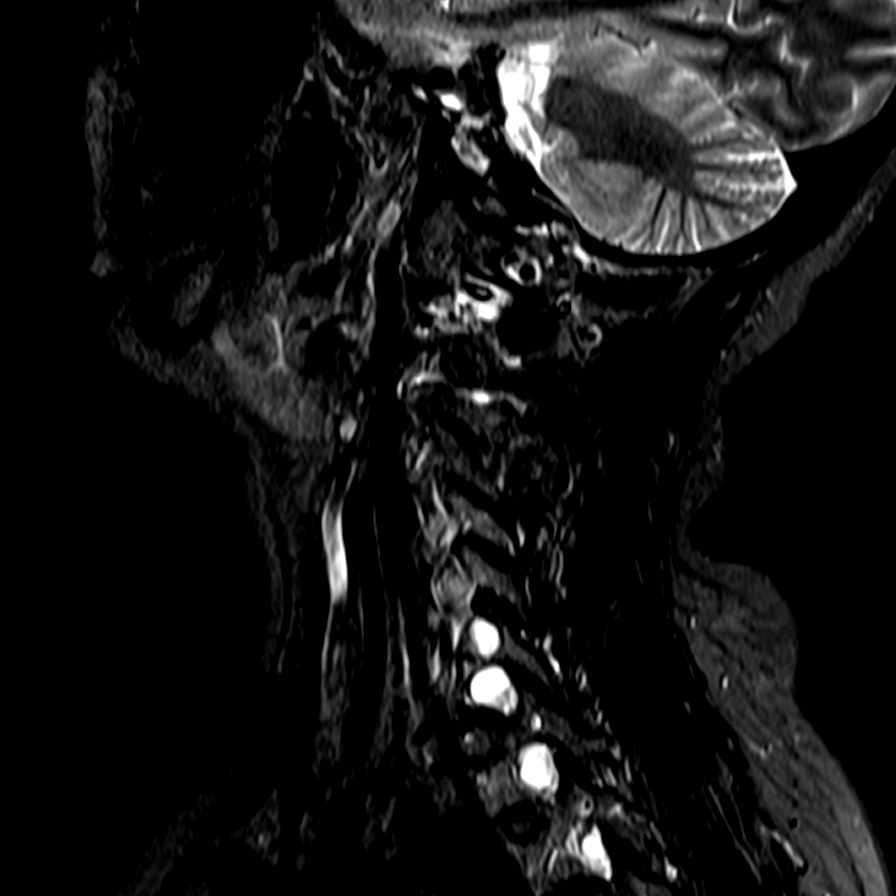

[Series 8: T2 · axial · 3.0mm · 0.21mm/px · z∈[-52,+17]mm · 3 of 32 slices shown (2 of 2)]
[im 5/32]
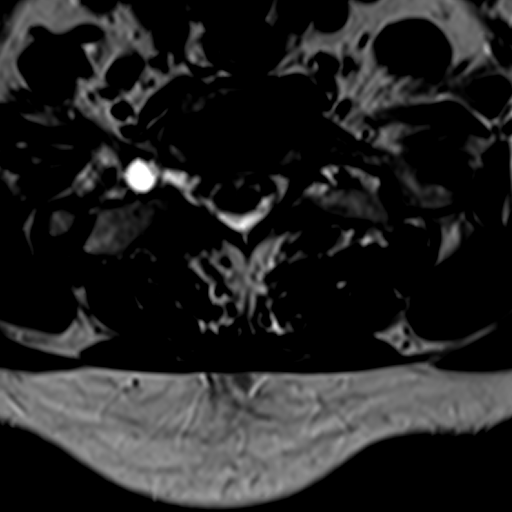
[im 16/32]
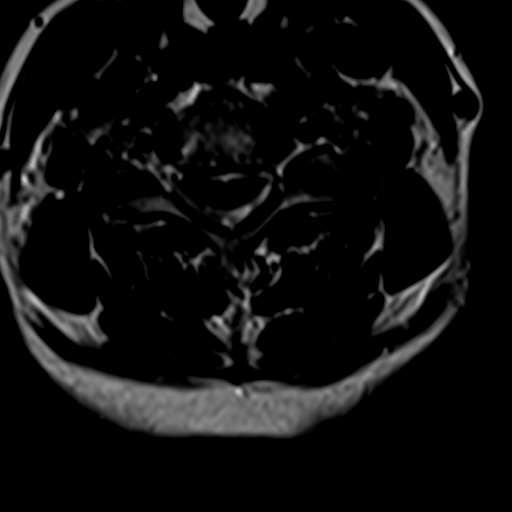
[im 27/32]
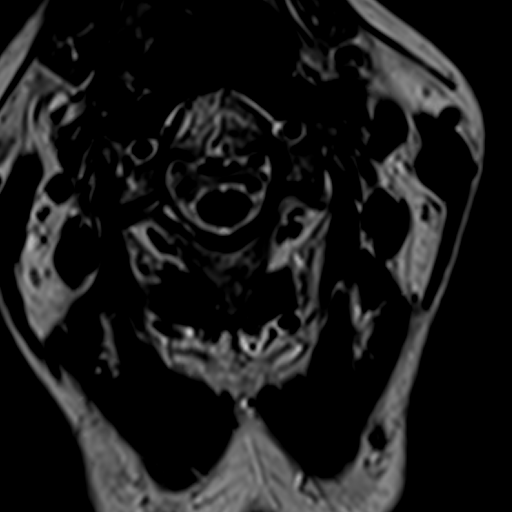

[14 of 48 positions shown; findings below may reference images not displayed]

FINDINGS: Alignment: 2 mm degenerative retrolisthesis at C5-6.

Vertebrae: Degenerative endplate disease at C5-6 and C6-7 with
associated spurring. Disc desiccation throughout the cervical spine
with loss of disc height severe at C5-6 and C6-7.

Cord: No significant abnormal spinal cord signal is observed. Slight
deviation of the cord due to the central narrowing of the thecal sac
at C5-6 and C6-7.

Posterior Fossa, vertebral arteries, paraspinal tissues: Subtle
accentuated T2 signal centrally in the pons likely due to chronic
ischemic microvascular white matter disease, for example image [DATE].

Disc levels:

C2-3:  Unremarkable.

C3-4:  No impingement, mild bilateral facet arthropathy.

C4-5:  No impingement, bilateral facet arthropathy.

C5-6: Moderate central narrowing of the thecal sac with moderate to
prominent left and mild right foraminal stenosis due to disc
osteophyte complex and facet arthropathy.

C6-7: Moderate bilateral foraminal stenosis and moderate right
eccentric central narrowing of the thecal sac due to disc osteophyte
complex and facet arthropathy. Left foraminal perineural cyst.

C7-T1:  No impingement.  Bilateral perineural cysts.

T1- 2:  No impingement.  Bilateral perineural cysts.
IMPRESSION: 1. Cervical spondylosis and degenerative disc disease, causing
moderate impingement at C5-6 and C6-7 as detailed above.
2. Perineural cysts at several levels. These are only very rarely
implicated as a cause for symptoms.
3. Subtle increased T2 signal in the central pons probably from
chronic ischemic microvascular white matter disease.

## 2018-11-14 ENCOUNTER — Other Ambulatory Visit: Payer: Self-pay

## 2018-11-14 ENCOUNTER — Ambulatory Visit (INDEPENDENT_AMBULATORY_CARE_PROVIDER_SITE_OTHER): Payer: PPO | Admitting: Internal Medicine

## 2018-11-14 ENCOUNTER — Encounter (INDEPENDENT_AMBULATORY_CARE_PROVIDER_SITE_OTHER): Payer: Self-pay | Admitting: Internal Medicine

## 2018-11-14 VITALS — BP 156/78 | HR 85 | Temp 98.2°F | Resp 18 | Ht 67.0 in | Wt 157.9 lb

## 2018-11-14 DIAGNOSIS — K219 Gastro-esophageal reflux disease without esophagitis: Secondary | ICD-10-CM

## 2018-11-14 DIAGNOSIS — K5900 Constipation, unspecified: Secondary | ICD-10-CM | POA: Diagnosis not present

## 2018-11-14 MED ORDER — LINACLOTIDE 72 MCG PO CAPS: 72.0000 ug | ORAL_CAPSULE | Freq: Every day | ORAL | 5 refills | Status: DC

## 2018-11-14 NOTE — Progress Notes (Signed)
Presenting complaint;  Follow-up for GERD.  Patient complains of bloating and constipation.  Database and subjective: Patient is 68 year old Caucasian female who is here for yearly visit.  She has chronic GERD and was last seen on 09/24/2017.  She had EGD back in December 2012 which revealed mild changes of reflux esophagitis limited to GE junction and antral gastritis.  H. pylori serology was negative. She underwent average rescreening colonoscopy in September 2019 with removal of a small polyp for transverse colon and it was tubular adenoma.  She also had small external hemorrhoids.  Follow-up exam was recommended in September 2026. On her last visit I recommended dropping pantoprazole to once a day and using famotidine OTC at bedtime.  Patient says she continues to do well as far as GERD symptoms are concerned.  She has heartburn only if she eats fatty food or sweets.  She does not have dysphagia nausea or vomiting.  She recalls she did have one episode of bad heartburn relieved with vomiting few months ago.  She complains of daily bloating.  She feels she has excessive flatus.  She also complains of sense of incomplete evacuation.  Her stool is firm she denies melena or rectal bleeding.  Her appetite is good and she has gained 7 pounds in 1 year.  She walks on a treadmill at least 5 times in a week and for 1 mile / 20 minutes each time.  She has not had pelvic exam in several years.  She does try to eat fiber rich foods. She stays away from dairy products as she has lactose intolerance.  Current Medications: Outpatient Encounter Medications as of 11/14/2018  Medication Sig  . buPROPion (WELLBUTRIN SR) 150 MG 12 hr tablet Take 150 mg by mouth 2 (two) times daily.    . Coenzyme Q10 (CO Q 10 PO) Take by mouth daily.  . diclofenac sodium (VOLTAREN) 1 % GEL Apply 2 g topically 4 (four) times daily. (Patient taking differently: Apply 2 g topically 4 (four) times daily as needed (for pain.). )  .  DULoxetine (CYMBALTA) 60 MG capsule Take 60 mg by mouth daily.   . famotidine (PEPCID) 20 MG tablet Take 20 mg by mouth at bedtime.  . gabapentin (NEURONTIN) 300 MG capsule Take 300 mg by mouth 3 (three) times daily as needed (for nerve issues with tongue or tongue burning).   . methylphenidate (RITALIN) 20 MG tablet Take 20 mg by mouth 2 (two) times daily.  . Multiple Vitamins-Minerals (MULTIVITAMINS THER. W/MINERALS) TABS Take 1 tablet by mouth every morning. One-A-Day for Women 50+  . pantoprazole (PROTONIX) 40 MG tablet Take 1 tablet (40 mg total) by mouth 2 (two) times daily before a meal. (Patient taking differently: Take 40 mg by mouth daily before breakfast. )  . pravastatin (PRAVACHOL) 80 MG tablet Take 80 mg by mouth at bedtime.  Marland Kitchen PREMARIN vaginal cream Place 1 Applicatorful vaginally 3 (three) times a week.   . Probiotic Product (ALIGN PO) Take by mouth daily.  . Simethicone (PHAZYME) 180 MG CAPS Take 1 capsule (180 mg total) by mouth 2 (two) times daily. (Patient taking differently: Take 180 mg by mouth 2 (two) times daily as needed (flatulence). )  . temazepam (RESTORIL) 22.5 MG capsule Take 22.5 mg by mouth at bedtime.  . traZODone (DESYREL) 50 MG tablet Take 25 mg by mouth at bedtime as needed.   Marland Kitchen VITAMIN D PO Take 2,000 Units by mouth daily.   Facility-Administered Encounter Medications as of 11/14/2018  Medication  . diclofenac sodium (VOLTAREN) 1 % transdermal gel 2 g     Objective: Blood pressure (!) 156/78, pulse 85, temperature 98.2 F (36.8 C), temperature source Oral, resp. rate 18, height 5\' 7"  (1.702 m), weight 157 lb 14.4 oz (71.6 kg). Patient is alert and in no acute distress. Conjunctiva is pink. Sclera is nonicteric Oropharyngeal mucosa is normal. No neck masses or thyromegaly noted. Cardiac exam with regular rhythm normal S1 and S2.  She has faint systolic murmur at left upper sternal border. Lungs are clear to auscultation. Abdomen is symmetrical soft and  nontender with organomegaly or masses.  Percussion note is tympanitic. No LE edema or clubbing noted. She has swelling to right wrist along the radial aspect at the base of thumb.  Labs/studies Results:  CBC Latest Ref Rng & Units 12/24/2017 06/19/2009 05/31/2009  WBC 4.0 - 10.5 K/uL 6.5 4.7 5.6  Hemoglobin 12.0 - 15.0 g/dL 13.0 13.9 13.6  Hematocrit 36.0 - 46.0 % 39.4 39.0 42.7  Platelets 150 - 400 K/uL 213 169 202    CMP Latest Ref Rng & Units 12/24/2017 09/10/2015 06/19/2009  Glucose 70 - 99 mg/dL 113(H) - 101(H)  BUN 8 - 23 mg/dL 20 - 9  Creatinine 0.44 - 1.00 mg/dL 0.88 0.80 0.82  Sodium 135 - 145 mmol/L 138 - 141  Potassium 3.5 - 5.1 mmol/L 4.1 - 4.8  Chloride 98 - 111 mmol/L 105 - 105  CO2 22 - 32 mmol/L 24 - 30  Calcium 8.9 - 10.3 mg/dL 9.2 - 9.5      Assessment:  #1.  GERD.  She is doing well with lesser dose of PPI and presently on a combination of a PPI and H2B.  She is tolerating both of these medications without any side effects.  No further changes in therapy planned at this time.  #2.  Constipation/defecation disorder.  It remains to be seen if she has a rectocele causing her symptoms or motility disorder.  If she does not respond to therapy would request pelvic exam by her gynecologist. Patient is up-to-date on screening for CRC.  Colonoscopy in September 2019 revealed single small tubular adenoma and next exam would be in September 2026.  Plan:  Continue pantoprazole 40 mg by mouth 30 minutes before breakfast daily and famotidine OTC 20 mg by mouth daily at bedtime. High-fiber diet. Linaclotide 72 mcg by mouth every morning.  Patient given 16 doses and prescription sent to her pharmacy. Patient will keep symptom diary and call with progress report in 1 month. Office visit in 1 year.

## 2018-11-14 NOTE — Patient Instructions (Signed)
Continue high-fiber diet. Keep symptom diary as to frequency of bowel movements and whether not bloating improves while on Linzess. Please call office with progress report in 1 month.

## 2018-11-18 ENCOUNTER — Other Ambulatory Visit: Payer: Self-pay

## 2018-11-18 DIAGNOSIS — Z20822 Contact with and (suspected) exposure to covid-19: Secondary | ICD-10-CM

## 2018-11-19 LAB — NOVEL CORONAVIRUS, NAA: SARS-CoV-2, NAA: NOT DETECTED

## 2019-01-13 DIAGNOSIS — F329 Major depressive disorder, single episode, unspecified: Secondary | ICD-10-CM | POA: Diagnosis not present

## 2019-01-13 DIAGNOSIS — M5030 Other cervical disc degeneration, unspecified cervical region: Secondary | ICD-10-CM | POA: Diagnosis not present

## 2019-01-13 DIAGNOSIS — F909 Attention-deficit hyperactivity disorder, unspecified type: Secondary | ICD-10-CM | POA: Diagnosis not present

## 2019-01-13 DIAGNOSIS — K219 Gastro-esophageal reflux disease without esophagitis: Secondary | ICD-10-CM | POA: Diagnosis not present

## 2019-01-13 DIAGNOSIS — Z23 Encounter for immunization: Secondary | ICD-10-CM | POA: Diagnosis not present

## 2019-01-16 LAB — RHEUMATOID FACTOR
ANA Direct: NEGATIVE
C-Reactive Protein, Quant: <1
Rheumatoid Arthritis Factor: <10
SED RATE BY MODIFIED WESTERGREN,MANUAL: 3

## 2019-01-26 DIAGNOSIS — K2100 Gastro-esophageal reflux disease with esophagitis, without bleeding: Secondary | ICD-10-CM | POA: Diagnosis not present

## 2019-01-26 DIAGNOSIS — M5030 Other cervical disc degeneration, unspecified cervical region: Secondary | ICD-10-CM | POA: Diagnosis not present

## 2019-01-26 DIAGNOSIS — F909 Attention-deficit hyperactivity disorder, unspecified type: Secondary | ICD-10-CM | POA: Diagnosis not present

## 2019-01-26 DIAGNOSIS — F329 Major depressive disorder, single episode, unspecified: Secondary | ICD-10-CM | POA: Diagnosis not present

## 2019-04-01 LAB — CBC WITH DIFF/PLATELET
BASO(ABSOLUTE): 0.1
Eosinophils Absolute: 0
HCT: 38 (ref 29–41)
Hemoglobin: 12.7
Immature Granulocytes: 0
MCH: 30.2
MCHC: 33.2
MCV: 91 (ref 76–111)
Monocytes: 10
Neutrophils: 54
RBC: 4.2 (ref 3.87–5.11)
RDW: 12.9
WBC: 5.2
lymph#: 32
platelet count: 150

## 2019-04-01 LAB — COMPREHENSIVE METABOLIC PANEL
ALT: 20 (ref 3–30)
AST: 25
Albumin/Globulin Ratio: 2.2
Albumin: 4.3
Alkaline Phosphatase: 86
BUN/Creatinine Ratio: 14
BUN: 14 (ref 4–21)
CO2: 24
Calcium: 9.4
Chloride: 108
Creatine, Serum: 1.01
EGFR (African American): 67
EGFR (Non-African Amer.): 58
Globulin, Total: 2
Glucose: 112
Potassium: 4.1
Sodium: 146
Total Bilirubin: 0.4
Total Protein: 6.3 — AB (ref 6.4–8.2)

## 2019-04-01 LAB — LIPID PANEL
A1c: 5.8
Cholesterol: 151
HDL Cholesterol: 70 (ref 35–70)
LDL Cholesterol: 65
TSH: 1.29
Triglycerides: 88 (ref 40–160)
VLDL Cholesterol Cal: 16

## 2019-06-28 DIAGNOSIS — G894 Chronic pain syndrome: Secondary | ICD-10-CM | POA: Diagnosis not present

## 2019-06-28 DIAGNOSIS — M797 Fibromyalgia: Secondary | ICD-10-CM | POA: Diagnosis not present

## 2019-06-28 DIAGNOSIS — M5416 Radiculopathy, lumbar region: Secondary | ICD-10-CM | POA: Diagnosis not present

## 2019-06-28 DIAGNOSIS — M542 Cervicalgia: Secondary | ICD-10-CM | POA: Diagnosis not present

## 2019-06-28 DIAGNOSIS — M25519 Pain in unspecified shoulder: Secondary | ICD-10-CM | POA: Diagnosis not present

## 2019-06-28 DIAGNOSIS — M545 Low back pain: Secondary | ICD-10-CM | POA: Diagnosis not present

## 2019-07-17 ENCOUNTER — Ambulatory Visit: Payer: PPO | Admitting: Family Medicine

## 2019-08-16 DIAGNOSIS — M797 Fibromyalgia: Secondary | ICD-10-CM | POA: Diagnosis not present

## 2019-08-16 DIAGNOSIS — Z91018 Allergy to other foods: Secondary | ICD-10-CM | POA: Diagnosis not present

## 2019-08-16 DIAGNOSIS — Z0189 Encounter for other specified special examinations: Secondary | ICD-10-CM | POA: Diagnosis not present

## 2019-08-16 DIAGNOSIS — G4700 Insomnia, unspecified: Secondary | ICD-10-CM | POA: Diagnosis not present

## 2019-08-16 DIAGNOSIS — K5900 Constipation, unspecified: Secondary | ICD-10-CM | POA: Diagnosis not present

## 2019-08-16 DIAGNOSIS — F331 Major depressive disorder, recurrent, moderate: Secondary | ICD-10-CM | POA: Diagnosis not present

## 2019-08-16 DIAGNOSIS — M19031 Primary osteoarthritis, right wrist: Secondary | ICD-10-CM | POA: Diagnosis not present

## 2019-08-16 DIAGNOSIS — K219 Gastro-esophageal reflux disease without esophagitis: Secondary | ICD-10-CM | POA: Diagnosis not present

## 2019-08-16 DIAGNOSIS — E785 Hyperlipidemia, unspecified: Secondary | ICD-10-CM | POA: Diagnosis not present

## 2019-08-18 DIAGNOSIS — M545 Low back pain: Secondary | ICD-10-CM | POA: Diagnosis not present

## 2019-08-18 DIAGNOSIS — M5416 Radiculopathy, lumbar region: Secondary | ICD-10-CM | POA: Diagnosis not present

## 2019-08-31 DIAGNOSIS — M797 Fibromyalgia: Secondary | ICD-10-CM | POA: Diagnosis not present

## 2019-08-31 DIAGNOSIS — M25519 Pain in unspecified shoulder: Secondary | ICD-10-CM | POA: Diagnosis not present

## 2019-08-31 DIAGNOSIS — M545 Low back pain: Secondary | ICD-10-CM | POA: Diagnosis not present

## 2019-08-31 DIAGNOSIS — M542 Cervicalgia: Secondary | ICD-10-CM | POA: Diagnosis not present

## 2019-08-31 DIAGNOSIS — G4700 Insomnia, unspecified: Secondary | ICD-10-CM | POA: Diagnosis not present

## 2019-08-31 DIAGNOSIS — M5416 Radiculopathy, lumbar region: Secondary | ICD-10-CM | POA: Diagnosis not present

## 2019-09-07 DIAGNOSIS — Z0000 Encounter for general adult medical examination without abnormal findings: Secondary | ICD-10-CM | POA: Diagnosis not present

## 2019-09-07 DIAGNOSIS — E785 Hyperlipidemia, unspecified: Secondary | ICD-10-CM | POA: Diagnosis not present

## 2019-09-07 DIAGNOSIS — E039 Hypothyroidism, unspecified: Secondary | ICD-10-CM | POA: Diagnosis not present

## 2019-09-12 DIAGNOSIS — E785 Hyperlipidemia, unspecified: Secondary | ICD-10-CM | POA: Diagnosis not present

## 2019-09-12 DIAGNOSIS — K219 Gastro-esophageal reflux disease without esophagitis: Secondary | ICD-10-CM | POA: Diagnosis not present

## 2019-09-12 DIAGNOSIS — F331 Major depressive disorder, recurrent, moderate: Secondary | ICD-10-CM | POA: Diagnosis not present

## 2019-09-12 DIAGNOSIS — K5900 Constipation, unspecified: Secondary | ICD-10-CM | POA: Diagnosis not present

## 2019-09-12 DIAGNOSIS — Z91018 Allergy to other foods: Secondary | ICD-10-CM | POA: Diagnosis not present

## 2019-09-12 DIAGNOSIS — M19031 Primary osteoarthritis, right wrist: Secondary | ICD-10-CM | POA: Diagnosis not present

## 2019-09-12 DIAGNOSIS — G4700 Insomnia, unspecified: Secondary | ICD-10-CM | POA: Diagnosis not present

## 2019-09-12 DIAGNOSIS — M797 Fibromyalgia: Secondary | ICD-10-CM | POA: Diagnosis not present

## 2019-09-12 DIAGNOSIS — N1831 Chronic kidney disease, stage 3a: Secondary | ICD-10-CM | POA: Diagnosis not present

## 2019-11-14 ENCOUNTER — Ambulatory Visit (INDEPENDENT_AMBULATORY_CARE_PROVIDER_SITE_OTHER): Payer: PPO | Admitting: Internal Medicine

## 2019-11-29 DIAGNOSIS — M5416 Radiculopathy, lumbar region: Secondary | ICD-10-CM | POA: Diagnosis not present

## 2019-11-29 DIAGNOSIS — G4700 Insomnia, unspecified: Secondary | ICD-10-CM | POA: Diagnosis not present

## 2019-11-29 DIAGNOSIS — M545 Low back pain: Secondary | ICD-10-CM | POA: Diagnosis not present

## 2019-11-29 DIAGNOSIS — M25519 Pain in unspecified shoulder: Secondary | ICD-10-CM | POA: Diagnosis not present

## 2019-11-29 DIAGNOSIS — M542 Cervicalgia: Secondary | ICD-10-CM | POA: Diagnosis not present

## 2019-11-29 DIAGNOSIS — M797 Fibromyalgia: Secondary | ICD-10-CM | POA: Diagnosis not present

## 2020-04-09 ENCOUNTER — Ambulatory Visit (INDEPENDENT_AMBULATORY_CARE_PROVIDER_SITE_OTHER): Payer: PPO | Admitting: Internal Medicine

## 2020-06-05 DIAGNOSIS — M542 Cervicalgia: Secondary | ICD-10-CM | POA: Diagnosis not present

## 2020-06-05 DIAGNOSIS — M5416 Radiculopathy, lumbar region: Secondary | ICD-10-CM | POA: Diagnosis not present

## 2020-06-05 DIAGNOSIS — G4700 Insomnia, unspecified: Secondary | ICD-10-CM | POA: Diagnosis not present

## 2020-06-05 DIAGNOSIS — M25519 Pain in unspecified shoulder: Secondary | ICD-10-CM | POA: Diagnosis not present

## 2020-06-05 DIAGNOSIS — M797 Fibromyalgia: Secondary | ICD-10-CM | POA: Diagnosis not present

## 2020-06-05 DIAGNOSIS — M545 Low back pain, unspecified: Secondary | ICD-10-CM | POA: Diagnosis not present

## 2020-07-30 ENCOUNTER — Ambulatory Visit (INDEPENDENT_AMBULATORY_CARE_PROVIDER_SITE_OTHER): Payer: PPO | Admitting: Internal Medicine

## 2020-09-16 DIAGNOSIS — F329 Major depressive disorder, single episode, unspecified: Secondary | ICD-10-CM | POA: Diagnosis not present

## 2020-09-16 DIAGNOSIS — I10 Essential (primary) hypertension: Secondary | ICD-10-CM | POA: Diagnosis not present

## 2020-10-10 DIAGNOSIS — I120 Hypertensive chronic kidney disease with stage 5 chronic kidney disease or end stage renal disease: Secondary | ICD-10-CM | POA: Diagnosis not present

## 2020-10-10 DIAGNOSIS — E1165 Type 2 diabetes mellitus with hyperglycemia: Secondary | ICD-10-CM | POA: Diagnosis not present

## 2020-10-17 DIAGNOSIS — I10 Essential (primary) hypertension: Secondary | ICD-10-CM | POA: Diagnosis not present

## 2020-10-17 DIAGNOSIS — F909 Attention-deficit hyperactivity disorder, unspecified type: Secondary | ICD-10-CM | POA: Diagnosis not present

## 2020-10-17 DIAGNOSIS — K146 Glossodynia: Secondary | ICD-10-CM | POA: Diagnosis not present

## 2020-10-29 DIAGNOSIS — Z01419 Encounter for gynecological examination (general) (routine) without abnormal findings: Secondary | ICD-10-CM | POA: Diagnosis not present

## 2020-10-29 DIAGNOSIS — N952 Postmenopausal atrophic vaginitis: Secondary | ICD-10-CM | POA: Diagnosis not present

## 2020-11-07 DIAGNOSIS — I10 Essential (primary) hypertension: Secondary | ICD-10-CM | POA: Diagnosis not present

## 2020-11-26 ENCOUNTER — Ambulatory Visit (INDEPENDENT_AMBULATORY_CARE_PROVIDER_SITE_OTHER): Payer: PPO | Admitting: Gastroenterology

## 2020-11-26 ENCOUNTER — Ambulatory Visit (INDEPENDENT_AMBULATORY_CARE_PROVIDER_SITE_OTHER): Payer: PPO | Admitting: Internal Medicine

## 2020-11-26 ENCOUNTER — Encounter (INDEPENDENT_AMBULATORY_CARE_PROVIDER_SITE_OTHER): Payer: Self-pay | Admitting: Gastroenterology

## 2020-12-26 DIAGNOSIS — G4700 Insomnia, unspecified: Secondary | ICD-10-CM | POA: Diagnosis not present

## 2020-12-26 DIAGNOSIS — M542 Cervicalgia: Secondary | ICD-10-CM | POA: Diagnosis not present

## 2020-12-26 DIAGNOSIS — M5416 Radiculopathy, lumbar region: Secondary | ICD-10-CM | POA: Diagnosis not present

## 2020-12-26 DIAGNOSIS — M25519 Pain in unspecified shoulder: Secondary | ICD-10-CM | POA: Diagnosis not present

## 2020-12-26 DIAGNOSIS — M545 Low back pain, unspecified: Secondary | ICD-10-CM | POA: Diagnosis not present

## 2020-12-26 DIAGNOSIS — M797 Fibromyalgia: Secondary | ICD-10-CM | POA: Diagnosis not present

## 2021-01-13 ENCOUNTER — Other Ambulatory Visit (HOSPITAL_COMMUNITY): Payer: Self-pay | Admitting: Internal Medicine

## 2021-01-13 DIAGNOSIS — Z1231 Encounter for screening mammogram for malignant neoplasm of breast: Secondary | ICD-10-CM

## 2021-01-20 ENCOUNTER — Ambulatory Visit (HOSPITAL_COMMUNITY): Payer: PPO

## 2021-01-29 ENCOUNTER — Ambulatory Visit (HOSPITAL_COMMUNITY): Payer: PPO

## 2021-02-05 DIAGNOSIS — Z0001 Encounter for general adult medical examination with abnormal findings: Secondary | ICD-10-CM | POA: Diagnosis not present

## 2021-02-10 DIAGNOSIS — E1165 Type 2 diabetes mellitus with hyperglycemia: Secondary | ICD-10-CM | POA: Diagnosis not present

## 2021-02-10 DIAGNOSIS — I10 Essential (primary) hypertension: Secondary | ICD-10-CM | POA: Diagnosis not present

## 2021-03-12 DIAGNOSIS — I10 Essential (primary) hypertension: Secondary | ICD-10-CM | POA: Diagnosis not present

## 2021-03-12 DIAGNOSIS — E1165 Type 2 diabetes mellitus with hyperglycemia: Secondary | ICD-10-CM | POA: Diagnosis not present

## 2021-04-11 DIAGNOSIS — I10 Essential (primary) hypertension: Secondary | ICD-10-CM | POA: Diagnosis not present

## 2021-04-11 DIAGNOSIS — E782 Mixed hyperlipidemia: Secondary | ICD-10-CM | POA: Diagnosis not present

## 2021-04-11 DIAGNOSIS — E1165 Type 2 diabetes mellitus with hyperglycemia: Secondary | ICD-10-CM | POA: Diagnosis not present

## 2021-05-13 DIAGNOSIS — E785 Hyperlipidemia, unspecified: Secondary | ICD-10-CM | POA: Diagnosis not present

## 2021-05-13 DIAGNOSIS — I10 Essential (primary) hypertension: Secondary | ICD-10-CM | POA: Diagnosis not present

## 2021-06-10 DIAGNOSIS — E119 Type 2 diabetes mellitus without complications: Secondary | ICD-10-CM | POA: Diagnosis not present

## 2021-06-10 DIAGNOSIS — I10 Essential (primary) hypertension: Secondary | ICD-10-CM | POA: Diagnosis not present

## 2021-07-11 DIAGNOSIS — E119 Type 2 diabetes mellitus without complications: Secondary | ICD-10-CM | POA: Diagnosis not present

## 2021-07-11 DIAGNOSIS — I10 Essential (primary) hypertension: Secondary | ICD-10-CM | POA: Diagnosis not present

## 2021-07-11 DIAGNOSIS — E785 Hyperlipidemia, unspecified: Secondary | ICD-10-CM | POA: Diagnosis not present

## 2021-07-22 DIAGNOSIS — M797 Fibromyalgia: Secondary | ICD-10-CM | POA: Diagnosis not present

## 2021-07-22 DIAGNOSIS — M25519 Pain in unspecified shoulder: Secondary | ICD-10-CM | POA: Diagnosis not present

## 2021-07-22 DIAGNOSIS — M542 Cervicalgia: Secondary | ICD-10-CM | POA: Diagnosis not present

## 2021-07-22 DIAGNOSIS — M545 Low back pain, unspecified: Secondary | ICD-10-CM | POA: Diagnosis not present

## 2021-07-22 DIAGNOSIS — Z79899 Other long term (current) drug therapy: Secondary | ICD-10-CM | POA: Diagnosis not present

## 2021-07-22 DIAGNOSIS — M5416 Radiculopathy, lumbar region: Secondary | ICD-10-CM | POA: Diagnosis not present

## 2021-07-22 DIAGNOSIS — G4700 Insomnia, unspecified: Secondary | ICD-10-CM | POA: Diagnosis not present

## 2021-08-03 DIAGNOSIS — N1831 Chronic kidney disease, stage 3a: Secondary | ICD-10-CM | POA: Insufficient documentation

## 2021-08-03 DIAGNOSIS — M797 Fibromyalgia: Secondary | ICD-10-CM | POA: Insufficient documentation

## 2021-08-06 DIAGNOSIS — E1165 Type 2 diabetes mellitus with hyperglycemia: Secondary | ICD-10-CM | POA: Diagnosis not present

## 2021-08-06 DIAGNOSIS — I10 Essential (primary) hypertension: Secondary | ICD-10-CM | POA: Diagnosis not present

## 2021-08-12 DIAGNOSIS — I10 Essential (primary) hypertension: Secondary | ICD-10-CM | POA: Diagnosis not present

## 2021-08-12 DIAGNOSIS — F909 Attention-deficit hyperactivity disorder, unspecified type: Secondary | ICD-10-CM | POA: Diagnosis not present

## 2021-08-12 DIAGNOSIS — N1831 Chronic kidney disease, stage 3a: Secondary | ICD-10-CM | POA: Diagnosis not present

## 2021-08-12 DIAGNOSIS — E785 Hyperlipidemia, unspecified: Secondary | ICD-10-CM | POA: Diagnosis not present

## 2021-11-24 DIAGNOSIS — F909 Attention-deficit hyperactivity disorder, unspecified type: Secondary | ICD-10-CM | POA: Diagnosis not present

## 2021-11-24 DIAGNOSIS — I10 Essential (primary) hypertension: Secondary | ICD-10-CM | POA: Diagnosis not present

## 2021-12-31 DIAGNOSIS — E1165 Type 2 diabetes mellitus with hyperglycemia: Secondary | ICD-10-CM | POA: Insufficient documentation

## 2022-01-06 DIAGNOSIS — M542 Cervicalgia: Secondary | ICD-10-CM | POA: Diagnosis not present

## 2022-01-06 DIAGNOSIS — M25519 Pain in unspecified shoulder: Secondary | ICD-10-CM | POA: Diagnosis not present

## 2022-01-06 DIAGNOSIS — M545 Low back pain, unspecified: Secondary | ICD-10-CM | POA: Diagnosis not present

## 2022-01-06 DIAGNOSIS — Z79899 Other long term (current) drug therapy: Secondary | ICD-10-CM | POA: Diagnosis not present

## 2022-01-06 DIAGNOSIS — M5416 Radiculopathy, lumbar region: Secondary | ICD-10-CM | POA: Diagnosis not present

## 2022-01-06 DIAGNOSIS — G4700 Insomnia, unspecified: Secondary | ICD-10-CM | POA: Diagnosis not present

## 2022-02-17 DIAGNOSIS — I10 Essential (primary) hypertension: Secondary | ICD-10-CM | POA: Diagnosis not present

## 2022-02-17 DIAGNOSIS — E1165 Type 2 diabetes mellitus with hyperglycemia: Secondary | ICD-10-CM | POA: Diagnosis not present

## 2022-02-17 DIAGNOSIS — E785 Hyperlipidemia, unspecified: Secondary | ICD-10-CM | POA: Diagnosis not present

## 2022-02-18 DIAGNOSIS — R7303 Prediabetes: Secondary | ICD-10-CM | POA: Insufficient documentation

## 2022-02-19 DIAGNOSIS — F909 Attention-deficit hyperactivity disorder, unspecified type: Secondary | ICD-10-CM | POA: Diagnosis not present

## 2022-02-19 DIAGNOSIS — Z733 Stress, not elsewhere classified: Secondary | ICD-10-CM | POA: Diagnosis not present

## 2022-02-19 DIAGNOSIS — R7303 Prediabetes: Secondary | ICD-10-CM | POA: Diagnosis not present

## 2022-02-19 DIAGNOSIS — Z23 Encounter for immunization: Secondary | ICD-10-CM | POA: Diagnosis not present

## 2022-02-19 DIAGNOSIS — R5382 Chronic fatigue, unspecified: Secondary | ICD-10-CM | POA: Diagnosis not present

## 2022-02-19 DIAGNOSIS — I10 Essential (primary) hypertension: Secondary | ICD-10-CM | POA: Diagnosis not present

## 2022-02-19 DIAGNOSIS — E785 Hyperlipidemia, unspecified: Secondary | ICD-10-CM | POA: Diagnosis not present

## 2022-02-19 DIAGNOSIS — N1831 Chronic kidney disease, stage 3a: Secondary | ICD-10-CM | POA: Diagnosis not present

## 2022-02-19 DIAGNOSIS — G9332 Myalgic encephalomyelitis/chronic fatigue syndrome: Secondary | ICD-10-CM | POA: Insufficient documentation

## 2022-09-09 DIAGNOSIS — R7303 Prediabetes: Secondary | ICD-10-CM | POA: Diagnosis not present

## 2022-09-09 DIAGNOSIS — E785 Hyperlipidemia, unspecified: Secondary | ICD-10-CM | POA: Diagnosis not present

## 2022-09-09 DIAGNOSIS — I10 Essential (primary) hypertension: Secondary | ICD-10-CM | POA: Diagnosis not present

## 2022-09-23 DIAGNOSIS — Z0000 Encounter for general adult medical examination without abnormal findings: Secondary | ICD-10-CM | POA: Diagnosis not present

## 2022-12-30 DIAGNOSIS — R7303 Prediabetes: Secondary | ICD-10-CM | POA: Diagnosis not present

## 2022-12-30 DIAGNOSIS — I10 Essential (primary) hypertension: Secondary | ICD-10-CM | POA: Diagnosis not present

## 2022-12-30 DIAGNOSIS — E785 Hyperlipidemia, unspecified: Secondary | ICD-10-CM | POA: Diagnosis not present

## 2023-01-07 ENCOUNTER — Other Ambulatory Visit (HOSPITAL_COMMUNITY): Payer: Self-pay | Admitting: Internal Medicine

## 2023-01-07 DIAGNOSIS — R7303 Prediabetes: Secondary | ICD-10-CM | POA: Diagnosis not present

## 2023-01-07 DIAGNOSIS — Z1231 Encounter for screening mammogram for malignant neoplasm of breast: Secondary | ICD-10-CM

## 2023-01-07 DIAGNOSIS — F909 Attention-deficit hyperactivity disorder, unspecified type: Secondary | ICD-10-CM | POA: Diagnosis not present

## 2023-01-07 DIAGNOSIS — E785 Hyperlipidemia, unspecified: Secondary | ICD-10-CM | POA: Diagnosis not present

## 2023-01-07 DIAGNOSIS — Z23 Encounter for immunization: Secondary | ICD-10-CM | POA: Diagnosis not present

## 2023-01-07 DIAGNOSIS — Z733 Stress, not elsewhere classified: Secondary | ICD-10-CM | POA: Diagnosis not present

## 2023-01-07 DIAGNOSIS — I129 Hypertensive chronic kidney disease with stage 1 through stage 4 chronic kidney disease, or unspecified chronic kidney disease: Secondary | ICD-10-CM | POA: Diagnosis not present

## 2023-01-07 DIAGNOSIS — T7840XA Allergy, unspecified, initial encounter: Secondary | ICD-10-CM | POA: Diagnosis not present

## 2023-01-07 DIAGNOSIS — M8580 Other specified disorders of bone density and structure, unspecified site: Secondary | ICD-10-CM | POA: Insufficient documentation

## 2023-01-07 DIAGNOSIS — F5105 Insomnia due to other mental disorder: Secondary | ICD-10-CM | POA: Diagnosis not present

## 2023-01-07 DIAGNOSIS — Z1239 Encounter for other screening for malignant neoplasm of breast: Secondary | ICD-10-CM | POA: Diagnosis not present

## 2023-01-07 DIAGNOSIS — R5382 Chronic fatigue, unspecified: Secondary | ICD-10-CM | POA: Diagnosis not present

## 2023-01-07 DIAGNOSIS — N1831 Chronic kidney disease, stage 3a: Secondary | ICD-10-CM | POA: Diagnosis not present

## 2023-01-07 DIAGNOSIS — G4700 Insomnia, unspecified: Secondary | ICD-10-CM | POA: Insufficient documentation

## 2023-01-18 ENCOUNTER — Ambulatory Visit (HOSPITAL_COMMUNITY): Payer: PPO

## 2023-01-18 ENCOUNTER — Other Ambulatory Visit (HOSPITAL_COMMUNITY): Payer: PPO

## 2023-01-27 ENCOUNTER — Ambulatory Visit (HOSPITAL_COMMUNITY)
Admission: RE | Admit: 2023-01-27 | Discharge: 2023-01-27 | Disposition: A | Discharge status: Home / Self Care | Payer: PPO | Source: Ambulatory Visit | Attending: Internal Medicine | Admitting: Internal Medicine

## 2023-01-27 DIAGNOSIS — Z780 Asymptomatic menopausal state: Secondary | ICD-10-CM | POA: Insufficient documentation

## 2023-01-27 DIAGNOSIS — M8589 Other specified disorders of bone density and structure, multiple sites: Secondary | ICD-10-CM | POA: Insufficient documentation

## 2023-01-27 DIAGNOSIS — R9230 Dense breasts, unspecified: Secondary | ICD-10-CM | POA: Diagnosis not present

## 2023-01-27 DIAGNOSIS — M8580 Other specified disorders of bone density and structure, unspecified site: Secondary | ICD-10-CM

## 2023-01-27 DIAGNOSIS — Z1231 Encounter for screening mammogram for malignant neoplasm of breast: Secondary | ICD-10-CM | POA: Insufficient documentation

## 2023-02-11 ENCOUNTER — Other Ambulatory Visit (INDEPENDENT_AMBULATORY_CARE_PROVIDER_SITE_OTHER): Payer: Self-pay

## 2023-02-11 ENCOUNTER — Ambulatory Visit: Payer: PPO | Admitting: Orthopedic Surgery

## 2023-02-11 ENCOUNTER — Encounter: Payer: Self-pay | Admitting: Orthopedic Surgery

## 2023-02-11 VITALS — BP 142/81 | HR 105 | Ht 67.0 in | Wt 152.0 lb

## 2023-02-11 DIAGNOSIS — G8929 Other chronic pain: Secondary | ICD-10-CM | POA: Diagnosis not present

## 2023-02-11 DIAGNOSIS — M7541 Impingement syndrome of right shoulder: Secondary | ICD-10-CM | POA: Diagnosis not present

## 2023-02-11 DIAGNOSIS — M25511 Pain in right shoulder: Secondary | ICD-10-CM | POA: Diagnosis not present

## 2023-02-11 MED ORDER — METHYLPREDNISOLONE ACETATE 40 MG/ML IJ SUSP
40.0000 mg | Freq: Once | INTRAMUSCULAR | Status: AC
Start: 2023-02-11 — End: 2023-02-11
  Administered 2023-02-11: 40 mg via INTRA_ARTICULAR

## 2023-02-11 NOTE — Addendum Note (Signed)
Addended byCaffie Damme on: 02/11/2023 01:47 PM   Modules accepted: Orders

## 2023-02-11 NOTE — Progress Notes (Signed)
Patient ID: Teresa [name], female   DOB: 02-02-1951, 72 y.o.   MRN: [id number]  SUMMARY AND PLAN:  79 F W/ SH IMPING SYDR  CAN'T TAKE NSAIDS (CR ELEVATED)   REC INJ AND TYLENOL   WOULD REPEAT IF NOT BETTER IN 2 WEEKS    Procedure note the subacromial injection shoulder RIGHT    Verbal consent was obtained to inject the  RIGHT   Shoulder  Timeout was completed to confirm the injection site is a subacromial space of the  RIGHT  shoulder   Medication used Depo-Medrol 40 mg and lidocaine 1% 3 cc  Anesthesia was provided by ethyl chloride  The injection was performed in the RIGHT  posterior subacromial space. After pinning the skin with alcohol and anesthetized the skin with ethyl chloride the subacromial space was injected using a 20-gauge needle. There were no complications  Sterile dressing was applied.      Chief Complaint  Patient presents with   Shoulder Pain    right      HPI Teresa [name] is a 72 y.o. female.  PAIN FOR 3 MONTHS  NEG TRAUMA PAIN REACHING OUT AND OVERHEAD  NO PAIN AT NIGHT   Treatment:  Nsaids no  PT no  Injection no   Current Outpatient Medications  Medication Instructions   DULoxetine (CYMBALTA) 60 mg, Oral, Daily   linaclotide (LINZESS) 72 mcg, Oral, Daily before breakfast   methylphenidate (RITALIN) 20 mg, Oral, 2 times daily   Multiple Vitamins-Minerals (MULTIVITAMINS THER. W/MINERALS) TABS 1 tablet, Oral, BH-each morning, One-A-Day for Women 50+   pantoprazole (PROTONIX) 40 mg, Oral, 2 times daily before meals   pravastatin (PRAVACHOL) 80 mg, Oral, Daily at bedtime   PREMARIN vaginal cream 1 Applicatorful, Vaginal, 3 times weekly   Simethicone (PHAZYME) 180 mg, Oral, 2 times daily   temazepam (RESTORIL) 22.5 mg, Oral, Daily at bedtime   VITAMIN D PO 2,000 Units, Oral, Daily    Allergies  Allergen Reactions   Chicken Allergy Anaphylaxis    Any meat at all  BITTEN BY TICK-DEVELOPED MAMMAL MEAT ALLERGY   Meat Extract  Anaphylaxis    Any meat at all BITTEN BY TICK-DEVELOPED MAMMAL MEAT ALLERGY    Milk (Cow) Other (See Comments)    All Diary Products due to the [employer] Allergy - Bitten by Tick   Milk-Related Compounds Other (See Comments)    All Diary Products due to the [employer] Allergy - Bitten by Tick   Almond Oil Other (See Comments)   Lactose Other (See Comments)    Gi upset   Lactose Intolerance (Gi) Other (See Comments)    Gi upset   Latex Rash   Lisinopril Rash    Review of Systems ROS  Past Medical History:  Diagnosis Date   Arthritis    Depression    Disc disorder    Fibromyalgia    GERD (gastroesophageal reflux disease)    High cholesterol    Insomnia     Past Surgical History:  Procedure Laterality Date   BUNIONECTOMY Bilateral    COLONOSCOPY WITH PROPOFOL N/A 12/31/2017   Procedure: COLONOSCOPY WITH PROPOFOL;  Surgeon: Malissa Hippo, MD;  Location: [facility];  Service: Endoscopy;  Laterality: N/A;  7:30   FACIAL COSMETIC SURGERY     POLYPECTOMY  12/31/2017   Procedure: POLYPECTOMY;  Surgeon: Malissa Hippo, MD;  Location: [facility];  Service: Endoscopy;;  Transverse colon (CBx1)   TONSILLECTOMY  Family History  Problem Relation Age of Onset   Diabetes Mother    Stroke Mother    Kidney disease Mother    Depression Mother    Heart disease Mother    Hypertension Mother    Clotting disorder Mother    Arthritis Mother    Osteoporosis Mother    Ulcers Father    GER disease Father    Alcohol abuse Father    Diabetes Sister    Hypertension Sister    Arthritis Sister    Asthma Maternal Grandmother    Colon cancer Neg Hx      Social History   Tobacco Use   Smoking status: Never   Smokeless tobacco: Never  Vaping Use   Vaping status: Never Used  Substance Use Topics   Alcohol use: No    Alcohol/week: 0.0 standard drinks of alcohol   Drug use: No    Allergies  Allergen Reactions   Chicken Allergy Anaphylaxis    Any meat at all   BITTEN BY TICK-DEVELOPED MAMMAL MEAT ALLERGY   Meat Extract Anaphylaxis    Any meat at all BITTEN BY TICK-DEVELOPED MAMMAL MEAT ALLERGY    Milk (Cow) Other (See Comments)    All Diary Products due to the [employer] Allergy - Bitten by Tick   Milk-Related Compounds Other (See Comments)    All Diary Products due to the [employer] Allergy - Bitten by Tick   Almond Oil Other (See Comments)   Lactose Other (See Comments)    Gi upset   Lactose Intolerance (Gi) Other (See Comments)    Gi upset   Latex Rash   Lisinopril Rash    @ALL @  Current Meds  Medication Sig   DULoxetine (CYMBALTA) 60 MG capsule Take 60 mg by mouth daily.    linaclotide (LINZESS) 72 MCG capsule Take 1 capsule (72 mcg total) by mouth daily before breakfast.   methylphenidate (RITALIN) 20 MG tablet Take 20 mg by mouth 2 (two) times daily.   Multiple Vitamins-Minerals (MULTIVITAMINS THER. W/MINERALS) TABS Take 1 tablet by mouth every morning. One-A-Day for Women 50+   pantoprazole (PROTONIX) 40 MG tablet Take 1 tablet (40 mg total) by mouth 2 (two) times daily before a meal. (Patient taking differently: Take 40 mg by mouth daily before breakfast.)   pravastatin (PRAVACHOL) 80 MG tablet Take 80 mg by mouth at bedtime.   PREMARIN vaginal cream Place 1 Applicatorful vaginally 3 (three) times a week.    Simethicone (PHAZYME) 180 MG CAPS Take 1 capsule (180 mg total) by mouth 2 (two) times daily. (Patient taking differently: Take 180 mg by mouth 2 (two) times daily as needed (flatulence).)   temazepam (RESTORIL) 22.5 MG capsule Take 22.5 mg by mouth at bedtime.   VITAMIN D PO Take 2,000 Units by mouth daily.   Current Facility-Administered Medications for the 02/11/23 encounter (Office Visit) with Vickki Hearing, MD  Medication   diclofenac sodium (VOLTAREN) 1 % transdermal gel 2 g       Physical Exam BP (!) 142/81   Pulse (!) 105   Ht 5\' 7"  (1.702 m)   Wt 152 lb (68.9 kg)   BMI 23.81 kg/m   Ambulatory  status normal with no assistive devices  GENERAL : APPEARANCE IS NORMAL GROOMING IS GOOD  NORMAL MOOD AND AFFECT  AWAKE ALERT AND ORIENTED X 3   Right  SHOULDER  TENDERNESS no  ROM normal  STABLE ANTERIORLY POSTERIORLY AND INFERIORLY SKIN CLEAN  PROVOCATIVE TESTS   IMPINGEMENT OF NEER  YES  DROP ARM TEST NEG PAINFUL ARC 0-120 EMPTY CAN -JOBST TEST NO EXTERNAL ROTATION LAG TEST NEG LIFT OFF TEST NORMAL  BELLYPRESS TEST NORMAL  MEDICAL DECISION MAKING  A.  Encounter Diagnoses  Name Primary?   Acute pain of right shoulder    Impingement syndrome of right shoulder Yes    B. DATA ANALYSED:    IMAGING: Independent interpretation of images: SEE XRAYS REPORT NORMAL   Orders: INJECTION   Outside records reviewed: NO  C. MANAGEMENT INJ AND TYLENOL   No orders of the defined types were placed in this encounter.

## 2023-02-25 ENCOUNTER — Encounter: Payer: Self-pay | Admitting: Orthopedic Surgery

## 2023-02-25 ENCOUNTER — Ambulatory Visit: Payer: PPO | Admitting: Orthopedic Surgery

## 2023-02-25 VITALS — BP 159/90 | HR 87 | Ht 67.0 in | Wt 158.8 lb

## 2023-02-25 DIAGNOSIS — M7541 Impingement syndrome of right shoulder: Secondary | ICD-10-CM | POA: Diagnosis not present

## 2023-02-25 DIAGNOSIS — G8929 Other chronic pain: Secondary | ICD-10-CM

## 2023-02-25 MED ORDER — PREDNISONE 10 MG (48) PO TBPK: ORAL_TABLET | Freq: Every day | ORAL | 0 refills | Status: DC

## 2023-02-25 MED ORDER — METHYLPREDNISOLONE ACETATE 40 MG/ML IJ SUSP
40.0000 mg | Freq: Once | INTRAMUSCULAR | Status: AC
Start: 2023-02-25 — End: 2023-02-25
  Administered 2023-02-25: 40 mg via INTRA_ARTICULAR

## 2023-02-25 NOTE — Progress Notes (Signed)
   VISIT TYPE: FOLLOW UP   Chief Complaint  Patient presents with   Shoulder Pain    R shoulder pain worse after injection.     Encounter Diagnoses  Name Primary?   Chronic right shoulder pain    Impingement syndrome of right shoulder Yes    Assessment and Plan: 72 year old female follow-up visit after injection for right shoulder impingement syndrome patient is allergic to several medications due to Alphagan allergy  She should not take anti-inflammatories because of kidney mild insufficiency  Her pain increased after the injection 2 weeks ago  She still exhibits decreased range of motion in all planes.  Abduction limited to less than 90 external rotation limited to 40  Forward elevation limited to less than 90 as well.  Prior x-ray was negative  Recommend Prednisone Dosepak Repeat subacromial injection MRI right shoulder  Patient advised to call me Monday because him going out of town and if the medication does not work I would like to start her on a mild opioid   Procedure note the subacromial injection shoulder RIGHT    Verbal consent was obtained to inject the  RIGHT   Shoulder  Timeout was completed to confirm the injection site is a subacromial space of the  RIGHT  shoulder   Medication used Depo-Medrol 40 mg and lidocaine 1% 3 cc  Anesthesia was provided by ethyl chloride  The injection was performed in the RIGHT  posterior subacromial space. After pinning the skin with alcohol and anesthetized the skin with ethyl chloride the subacromial space was injected using a 20-gauge needle. There were no complications  Sterile dressing was applied.  Meds ordered this encounter  Medications   predniSONE (STERAPRED UNI-PAK 48 TAB) 10 MG (48) TBPK tablet    Sig: Take by mouth daily. 10 mg 12 days as dir    Dispense:  48 tablet    Refill:  0   methylPREDNISolone acetate (DEPO-MEDROL) injection 40 mg    BP (!) 159/90   Pulse 87   Ht 5\' 7"  (1.702 m)   Wt 158  lb 12.8 oz (72 kg)   BMI 24.87 kg/m     Encounter Diagnoses  Name Primary?   Chronic right shoulder pain    Impingement syndrome of right shoulder Yes    Meds ordered this encounter  Medications   predniSONE (STERAPRED UNI-PAK 48 TAB) 10 MG (48) TBPK tablet    Sig: Take by mouth daily. 10 mg 12 days as dir    Dispense:  48 tablet    Refill:  0   methylPREDNISolone acetate (DEPO-MEDROL) injection 40 mg

## 2023-02-25 NOTE — Patient Instructions (Signed)
Central scheduling [phone]  Dr Romeo Apple will contact with results by phone

## 2023-03-01 ENCOUNTER — Telehealth: Payer: Self-pay | Admitting: Orthopedic Surgery

## 2023-03-01 NOTE — Telephone Encounter (Signed)
Dr. Mort Sawyers pt - pt lvm stating she got an injection last week and that Dr. Romeo Apple wanted her to call back today to let him know how she is doing since he's leaving tomorrow.  Her shoulder is better and she had been taking the Prednisone, about 75% better.

## 2023-03-01 NOTE — Telephone Encounter (Signed)
Thanks. I have called her to advise told her to make appointment for follow up. Transferred her to schedule

## 2023-03-06 ENCOUNTER — Ambulatory Visit (HOSPITAL_COMMUNITY): Payer: PPO

## 2023-03-17 ENCOUNTER — Ambulatory Visit (HOSPITAL_COMMUNITY)
Admission: RE | Admit: 2023-03-17 | Discharge: 2023-03-17 | Disposition: A | Discharge status: Home / Self Care | Payer: PPO | Source: Ambulatory Visit | Attending: Orthopedic Surgery | Admitting: Orthopedic Surgery

## 2023-03-17 DIAGNOSIS — S43431A Superior glenoid labrum lesion of right shoulder, initial encounter: Secondary | ICD-10-CM | POA: Diagnosis not present

## 2023-03-17 DIAGNOSIS — M7581 Other shoulder lesions, right shoulder: Secondary | ICD-10-CM | POA: Diagnosis not present

## 2023-03-17 DIAGNOSIS — M25511 Pain in right shoulder: Secondary | ICD-10-CM | POA: Insufficient documentation

## 2023-03-17 DIAGNOSIS — G8929 Other chronic pain: Secondary | ICD-10-CM | POA: Insufficient documentation

## 2023-03-17 DIAGNOSIS — M25411 Effusion, right shoulder: Secondary | ICD-10-CM | POA: Diagnosis not present

## 2023-03-17 DIAGNOSIS — M129 Arthropathy, unspecified: Secondary | ICD-10-CM | POA: Diagnosis not present

## 2023-03-26 ENCOUNTER — Ambulatory Visit: Payer: PPO | Admitting: Orthopedic Surgery

## 2023-04-02 ENCOUNTER — Ambulatory Visit: Payer: PPO | Admitting: Orthopedic Surgery

## 2023-04-02 DIAGNOSIS — M7501 Adhesive capsulitis of right shoulder: Secondary | ICD-10-CM | POA: Diagnosis not present

## 2023-04-02 MED ORDER — PREDNISONE 10 MG PO TABS
10.0000 mg | ORAL_TABLET | Freq: Two times a day (BID) | ORAL | 1 refills | Status: DC
Start: 2023-04-02 — End: 2023-10-17

## 2023-04-02 NOTE — Progress Notes (Signed)
Follow-up appointment  Encounter Diagnosis  Name Primary?   Adhesive capsulitis of right shoulder Yes    Chief Complaint  Patient presents with   Shoulder Pain    Right review MRI / patient states she thinks she is having pain from Fibromyalgia     Still having pain and stiffness left shoulder  She looked up some Internet information on frozen shoulder thinks she can do exercises at home on her own.  We did recommend PT.  Her husband however is having some difficulties and she does not like to leave them alone for long time  The MRI was reviewed there is no obvious rotator cuff tear.  She has tendinosis capsular thickening there was no trauma so after reviewing the MRI I do not think there is a surgical lesion here  Recommend physical therapy first at home recheck 4 to 6 weeks and if not getting better may consider going to therapy but again we have to consider the social situation   IMPRESSION: 1. Severe thickening and increased signal of the posterior band of the humeral insertion of the IGHL concerning for severe ligament strain and partial-thickness tear. 2. Moderate tendinosis of the intra-articular portion of the long head of the biceps tendon. 3. Mild subscapularis tendinosis. 4. Moderate partial-thickness cartilage loss of the glenohumeral joint.

## 2023-08-11 DIAGNOSIS — M8580 Other specified disorders of bone density and structure, unspecified site: Secondary | ICD-10-CM | POA: Diagnosis not present

## 2023-08-11 DIAGNOSIS — E785 Hyperlipidemia, unspecified: Secondary | ICD-10-CM | POA: Diagnosis not present

## 2023-08-11 DIAGNOSIS — R7303 Prediabetes: Secondary | ICD-10-CM | POA: Diagnosis not present

## 2023-08-18 DIAGNOSIS — I129 Hypertensive chronic kidney disease with stage 1 through stage 4 chronic kidney disease, or unspecified chronic kidney disease: Secondary | ICD-10-CM | POA: Diagnosis not present

## 2023-08-18 DIAGNOSIS — F909 Attention-deficit hyperactivity disorder, unspecified type: Secondary | ICD-10-CM | POA: Diagnosis not present

## 2023-08-18 DIAGNOSIS — N1831 Chronic kidney disease, stage 3a: Secondary | ICD-10-CM | POA: Diagnosis not present

## 2023-08-18 DIAGNOSIS — M8580 Other specified disorders of bone density and structure, unspecified site: Secondary | ICD-10-CM | POA: Diagnosis not present

## 2023-08-18 DIAGNOSIS — T7840XS Allergy, unspecified, sequela: Secondary | ICD-10-CM | POA: Diagnosis not present

## 2023-08-18 DIAGNOSIS — E785 Hyperlipidemia, unspecified: Secondary | ICD-10-CM | POA: Diagnosis not present

## 2023-08-18 DIAGNOSIS — R5382 Chronic fatigue, unspecified: Secondary | ICD-10-CM | POA: Diagnosis not present

## 2023-08-18 DIAGNOSIS — R7303 Prediabetes: Secondary | ICD-10-CM | POA: Diagnosis not present

## 2023-08-18 DIAGNOSIS — F329 Major depressive disorder, single episode, unspecified: Secondary | ICD-10-CM | POA: Diagnosis not present

## 2023-08-18 DIAGNOSIS — G4700 Insomnia, unspecified: Secondary | ICD-10-CM | POA: Diagnosis not present

## 2023-08-18 DIAGNOSIS — Z634 Disappearance and death of family member: Secondary | ICD-10-CM | POA: Diagnosis not present

## 2023-09-29 DIAGNOSIS — M7501 Adhesive capsulitis of right shoulder: Secondary | ICD-10-CM | POA: Diagnosis not present

## 2023-09-29 DIAGNOSIS — I10 Essential (primary) hypertension: Secondary | ICD-10-CM | POA: Diagnosis not present

## 2023-09-29 DIAGNOSIS — G8929 Other chronic pain: Secondary | ICD-10-CM | POA: Diagnosis not present

## 2023-09-29 DIAGNOSIS — M545 Low back pain, unspecified: Secondary | ICD-10-CM | POA: Diagnosis not present

## 2023-10-04 ENCOUNTER — Telehealth: Payer: Self-pay | Admitting: Orthopedic Surgery

## 2023-10-04 NOTE — Telephone Encounter (Signed)
 Returned the pt's call regarding scheduling for her back, lvm for her to cb

## 2023-10-17 ENCOUNTER — Encounter: Payer: Self-pay | Admitting: Emergency Medicine

## 2023-10-17 ENCOUNTER — Ambulatory Visit
Admission: EM | Admit: 2023-10-17 | Discharge: 2023-10-17 | Disposition: A | Discharge status: Home / Self Care | Source: Ambulatory Visit | Attending: Nurse Practitioner | Admitting: Nurse Practitioner

## 2023-10-17 DIAGNOSIS — N390 Urinary tract infection, site not specified: Secondary | ICD-10-CM | POA: Insufficient documentation

## 2023-10-17 DIAGNOSIS — N3000 Acute cystitis without hematuria: Secondary | ICD-10-CM | POA: Insufficient documentation

## 2023-10-17 HISTORY — DX: Essential (primary) hypertension: I10

## 2023-10-17 LAB — POCT URINALYSIS DIP (MANUAL ENTRY)
Glucose, UA: NEGATIVE mg/dL
Nitrite, UA: POSITIVE — AB
Protein Ur, POC: >=300 mg/dL — AB
Spec Grav, UA: >=1.03 — AB (ref 1.010–1.025)
Urobilinogen, UA: 1 U/dL
pH, UA: 5.5 (ref 5.0–8.0)

## 2023-10-17 MED ORDER — CEPHALEXIN 500 MG PO CAPS: 500.0000 mg | ORAL_CAPSULE | Freq: Four times a day (QID) | ORAL | 0 refills | Status: DC

## 2023-10-17 MED ORDER — ONDANSETRON 4 MG PO TBDP: 4.0000 mg | ORAL_TABLET | Freq: Three times a day (TID) | ORAL | 0 refills | Status: DC | PRN

## 2023-10-17 NOTE — Discharge Instructions (Signed)
 A urine culture has been ordered.  You will be contacted if the antibiotic prescribed today needs to be changed.  You also have access to your results via MyChart. Take medication as prescribed. Increase fluids and allow for plenty of rest.  Try to drink at least 8-10 8 ounce glasses of water  daily while symptoms persist. You may continue over-the-counter Tylenol as needed for pain, fever, or general discomfort. Develop a toileting schedule that will allow you to urinate at least every 2 hours. Avoid caffeine such as tea, soda, or coffee while symptoms persist. If you are sexually active, make sure you are voiding at least 15 to 20 minutes after sexual intercourse. Go to the emergency department immediately if you experience worsening urinary symptoms or develop new symptoms of worsening fever, chills, nausea, vomiting, or other concerns. If symptoms fail to improve with this treatment, please follow-up with your primary care physician for further evaluation. Follow-up as needed.

## 2023-10-17 NOTE — ED Triage Notes (Signed)
 Lower back pain and urinary frequency with burning on urination x 3 days.

## 2023-10-17 NOTE — ED Provider Notes (Signed)
 [facility]    CSN: [id number] Arrival date & time: 10/17/23  0802      History   Chief Complaint No chief complaint on file.   HPI Teresa [name] is a 73 y.o. female.   The history is provided by the patient.   Presents with a 3-day history of urinary frequency, burning with urination, and low back pain.  Patient states that the low back pain has been present for almost 2 weeks.  She also thinks that she may have had a fever at home, states she has been taking Tylenol.  She denies urinary urgency, hesitancy, decreased urine stream, flank pain, or vaginal symptoms.  Patient denies history of recurrent UTIs.  She further denies history of kidney stones.  Patient has not taken any medication for her symptoms.  Patient also requests something for nausea if she has a UTI needs to be treated with an antibiotic.  Past Medical History:  Diagnosis Date   Arthritis    Depression    Disc disorder    Fibromyalgia    GERD (gastroesophageal reflux disease)    High cholesterol    Hypertension    Insomnia     Patient Active Problem List   Diagnosis Date Noted   Insomnia 01/07/2023   Osteopenia 01/07/2023   Allergic reaction 01/07/2023   Chronic fatigue syndrome 02/19/2022   Prediabetes 02/18/2022   Hyperglycemia due to type 2 diabetes mellitus (HCC) 12/31/2021   Chronic kidney disease, stage 3a (HCC) 08/03/2021   Fibromyalgia 08/03/2021   Essential hypertension 10/17/2020   Attention deficit hyperactivity disorder 10/17/2020   Burning mouth syndrome 10/17/2020   Major depressive disorder 09/16/2020   Constipation 11/14/2018   Special screening for malignant neoplasms, colon 09/16/2017   Arthritis of carpometacarpal [facility]) joint of right thumb 10/05/2016   Multinodular goiter 02/10/2016   Shoulder impingement syndrome 02/26/2014   Nevus, non-neoplastic 07/01/2011   GERD (gastroesophageal reflux disease) 05/21/2011   RUPTURE ROTATOR CUFF 07/18/2009   SHOULDER PAIN  07/01/2009   IMPINGEMENT SYNDROME 07/01/2009   SUPERIOR GLENOID LABRUM TEAR 07/01/2009   HYPERLIPIDEMIA 05/31/2009   TMJ PAIN 05/31/2009   FIBROMYALGIA 05/31/2009   FATIGUE 05/31/2009   Symptoms involving cardiovascular system 05/31/2009   DYSPNEA 05/31/2009   Chest pain, unspecified 05/31/2009    Past Surgical History:  Procedure Laterality Date   BUNIONECTOMY Bilateral    COLONOSCOPY WITH PROPOFOL  N/A 12/31/2017   Procedure: COLONOSCOPY WITH PROPOFOL ;  Surgeon: Golda Claudis PENNER, MD;  Location: [facility];  Service: Endoscopy;  Laterality: N/A;  7:30   FACIAL COSMETIC SURGERY     POLYPECTOMY  12/31/2017   Procedure: POLYPECTOMY;  Surgeon: Golda Claudis PENNER, MD;  Location: [facility];  Service: Endoscopy;;  Transverse colon (CBx1)   TONSILLECTOMY      OB History   No obstetric history on file.      Home Medications    Prior to Admission medications   Medication Sig Start Date End Date Taking? Authorizing Provider  buPROPion (WELLBUTRIN SR) 150 MG 12 hr tablet Take 1 tablet by mouth 2 (two) times daily. 03/15/23   [provider]  DULoxetine (CYMBALTA) 60 MG capsule Take 60 mg by mouth daily.     [provider]  hydrALAZINE (APRESOLINE) 25 MG tablet Take by mouth. 04/01/23   [provider]  Multiple Vitamins-Minerals (MULTIVITAMINS THER. W/MINERALS) TABS Take 1 tablet by mouth every morning. One-A-Day for Women 50+    [provider]  pantoprazole  (PROTONIX ) 40  MG tablet Take 1 tablet (40 mg total) by mouth 2 (two) times daily before a meal. Patient taking differently: Take 40 mg by mouth daily before breakfast. 09/01/16   Rehman, Claudis PENNER, MD  pravastatin (PRAVACHOL) 80 MG tablet Take 80 mg by mouth at bedtime.    [provider]  PREMARIN vaginal cream Place 1 Applicatorful vaginally 3 (three) times a week.  08/04/17   [provider]  Simethicone  (PHAZYME) 180 MG CAPS Take 1 capsule (180 mg total) by mouth 2 (two)  times daily. Patient taking differently: Take 180 mg by mouth 2 (two) times daily as needed (flatulence). 09/01/16   Rehman, Claudis PENNER, MD  temazepam (RESTORIL) 22.5 MG capsule Take 22.5 mg by mouth at bedtime. 09/12/17   [provider]  VITAMIN D PO Take 2,000 Units by mouth daily.    [provider]    Family History Family History  Problem Relation Age of Onset   Diabetes Mother    Stroke Mother    Kidney disease Mother    Depression Mother    Heart disease Mother    Hypertension Mother    Clotting disorder Mother    Arthritis Mother    Osteoporosis Mother    Ulcers Father    GER disease Father    Alcohol abuse Father    Diabetes Sister    Hypertension Sister    Arthritis Sister    Asthma Maternal Grandmother    Colon cancer Neg Hx     Social History Social History   Tobacco Use   Smoking status: Never   Smokeless tobacco: Never  Vaping Use   Vaping status: Never Used  Substance Use Topics   Alcohol use: No    Alcohol/week: 0.0 standard drinks of alcohol   Drug use: No     Allergies   Chicken allergy, Meat extract, Milk (cow), Milk-related compounds, Almond oil, Lactose, Lactose intolerance (gi), Mixed feathers, Latex, and Lisinopril   Review of Systems Review of Systems Per HPI  Physical Exam Triage Vital Signs ED Triage Vitals  Encounter Vitals Group     BP 10/17/23 0815 (!) 151/70     Girls Systolic BP Percentile --      Girls Diastolic BP Percentile --      Boys Systolic BP Percentile --      Boys Diastolic BP Percentile --      Pulse Rate 10/17/23 0815 87     Resp 10/17/23 0815 18     Temp 10/17/23 0815 98 F (36.7 C)     Temp Source 10/17/23 0815 Oral     SpO2 10/17/23 0815 92 %     Weight --      Height --      Head Circumference --      Peak Flow --      Pain Score 10/17/23 0816 4     Pain Loc --      Pain Education --      Exclude from Growth Chart --    No data found.  Updated Vital Signs BP (!) 151/70 (BP  Location: Right Arm)   Pulse 87   Temp 98 F (36.7 C) (Oral)   Resp 18   SpO2 92%   Visual Acuity Right Eye Distance:   Left Eye Distance:   Bilateral Distance:    Right Eye Near:   Left Eye Near:    Bilateral Near:     Physical Exam Vitals and nursing note reviewed.  Constitutional:  General: She is not in acute distress.    Appearance: Normal appearance.  HENT:     Head: Normocephalic.  Eyes:     Extraocular Movements: Extraocular movements intact.     Pupils: Pupils are equal, round, and reactive to light.  Cardiovascular:     Rate and Rhythm: Normal rate and regular rhythm.     Pulses: Normal pulses.     Heart sounds: Normal heart sounds.  Pulmonary:     Effort: Pulmonary effort is normal. No respiratory distress.     Breath sounds: Normal breath sounds. No stridor. No wheezing, rhonchi or rales.  Abdominal:     General: Bowel sounds are normal.     Palpations: Abdomen is soft.     Tenderness: There is no abdominal tenderness. There is right CVA tenderness. There is no left CVA tenderness.  Musculoskeletal:     Cervical back: Normal range of motion.  Skin:    General: Skin is warm and dry.  Neurological:     General: No focal deficit present.     Mental Status: She is alert and oriented to person, place, and time.  Psychiatric:        Mood and Affect: Mood normal.        Behavior: Behavior normal.      UC Treatments / Results  Labs (all labs ordered are listed, but only abnormal results are displayed) Labs Reviewed  POCT URINALYSIS DIP (MANUAL ENTRY) - Abnormal; Notable for the following components:      Result Value   Clarity, UA cloudy (*)    Bilirubin, UA moderate (*)    Ketones, POC UA trace (5) (*)    Spec Grav, UA >=1.030 (*)    Blood, UA large (*)    Protein Ur, POC >=300 (*)    Nitrite, UA Positive (*)    Leukocytes, UA Large (3+) (*)    All other components within normal limits  URINE CULTURE    EKG   Radiology No results  found.  Procedures Procedures (including critical care time)  Medications Ordered in UC Medications - No data to display  Initial Impression / Assessment and Plan / UC Course  I have reviewed the triage vital signs and the nursing notes.  Pertinent labs & imaging results that were available during my care of the patient were reviewed by me and considered in my medical decision making (see chart for details).  Urinalysis is positive for a UTI in the presence of leukocytes, nitrites, protein, and blood.  Will treat with Keflex  500 mg 4 times daily for the next 7 days.  Zofran  4 mg ODT for nausea.  Urine culture is pending.  Supportive care recommendations were provided and discussed with the patient to include fluids, rest, continuing over-the-counter Tylenol, developing a toileting schedule, and avoiding caffeine.  Discussed ER follow-up precautions with the patient.  Patient was in agreement with this plan of care and verbalizes understanding.  All questions were answered.  Patient stable for discharge.   Final Clinical Impressions(s) / UC Diagnoses   Final diagnoses:  Acute cystitis without hematuria   Discharge Instructions   None    ED Prescriptions   None    PDMP not reviewed this encounter.   Gilmer Etta PARAS, NP 10/17/23 [phone]

## 2023-10-19 ENCOUNTER — Ambulatory Visit (HOSPITAL_COMMUNITY): Payer: Self-pay

## 2023-10-19 LAB — URINE CULTURE: Culture: >=100000 — AB

## 2023-10-22 ENCOUNTER — Encounter: Admitting: Orthopedic Surgery

## 2023-10-25 DIAGNOSIS — M545 Low back pain, unspecified: Secondary | ICD-10-CM | POA: Diagnosis not present

## 2023-10-25 DIAGNOSIS — N390 Urinary tract infection, site not specified: Secondary | ICD-10-CM | POA: Diagnosis not present

## 2023-10-25 DIAGNOSIS — G8929 Other chronic pain: Secondary | ICD-10-CM | POA: Diagnosis not present

## 2023-10-25 DIAGNOSIS — I10 Essential (primary) hypertension: Secondary | ICD-10-CM | POA: Diagnosis not present

## 2023-11-18 ENCOUNTER — Ambulatory Visit (HOSPITAL_COMMUNITY)

## 2023-11-18 ENCOUNTER — Other Ambulatory Visit (HOSPITAL_COMMUNITY): Payer: Self-pay | Admitting: Internal Medicine

## 2023-11-18 DIAGNOSIS — R3129 Other microscopic hematuria: Secondary | ICD-10-CM

## 2023-11-19 ENCOUNTER — Ambulatory Visit (HOSPITAL_COMMUNITY)
Admission: RE | Admit: 2023-11-19 | Discharge: 2023-11-19 | Disposition: A | Discharge status: Home / Self Care | Source: Ambulatory Visit | Attending: Internal Medicine | Admitting: Internal Medicine

## 2023-11-19 DIAGNOSIS — K449 Diaphragmatic hernia without obstruction or gangrene: Secondary | ICD-10-CM | POA: Diagnosis not present

## 2023-11-19 DIAGNOSIS — S060X0A Concussion without loss of consciousness, initial encounter: Secondary | ICD-10-CM | POA: Diagnosis not present

## 2023-11-19 DIAGNOSIS — R3129 Other microscopic hematuria: Secondary | ICD-10-CM | POA: Diagnosis not present

## 2023-11-19 DIAGNOSIS — N132 Hydronephrosis with renal and ureteral calculous obstruction: Secondary | ICD-10-CM | POA: Diagnosis not present

## 2023-11-19 DIAGNOSIS — K7689 Other specified diseases of liver: Secondary | ICD-10-CM | POA: Diagnosis not present

## 2023-11-19 DIAGNOSIS — S0101XA Laceration without foreign body of scalp, initial encounter: Secondary | ICD-10-CM | POA: Diagnosis not present

## 2023-11-20 ENCOUNTER — Emergency Department (HOSPITAL_COMMUNITY)

## 2023-11-20 ENCOUNTER — Encounter (HOSPITAL_COMMUNITY): Payer: Self-pay | Admitting: Emergency Medicine

## 2023-11-20 ENCOUNTER — Other Ambulatory Visit: Payer: Self-pay

## 2023-11-20 ENCOUNTER — Emergency Department (HOSPITAL_COMMUNITY)
Admission: EM | Admit: 2023-11-20 | Discharge: 2023-11-20 | Disposition: A | Discharge status: Home / Self Care | Attending: Emergency Medicine | Admitting: Emergency Medicine

## 2023-11-20 DIAGNOSIS — I10 Essential (primary) hypertension: Secondary | ICD-10-CM | POA: Insufficient documentation

## 2023-11-20 DIAGNOSIS — Z23 Encounter for immunization: Secondary | ICD-10-CM | POA: Insufficient documentation

## 2023-11-20 DIAGNOSIS — W19XXXA Unspecified fall, initial encounter: Secondary | ICD-10-CM

## 2023-11-20 DIAGNOSIS — Z79899 Other long term (current) drug therapy: Secondary | ICD-10-CM | POA: Diagnosis not present

## 2023-11-20 DIAGNOSIS — Y93K9 Activity, other involving animal care: Secondary | ICD-10-CM | POA: Diagnosis not present

## 2023-11-20 DIAGNOSIS — S0101XA Laceration without foreign body of scalp, initial encounter: Secondary | ICD-10-CM | POA: Diagnosis not present

## 2023-11-20 DIAGNOSIS — S060X0A Concussion without loss of consciousness, initial encounter: Secondary | ICD-10-CM | POA: Insufficient documentation

## 2023-11-20 DIAGNOSIS — Z91040 Latex allergy status: Secondary | ICD-10-CM | POA: Insufficient documentation

## 2023-11-20 DIAGNOSIS — S0990XA Unspecified injury of head, initial encounter: Secondary | ICD-10-CM | POA: Diagnosis not present

## 2023-11-20 DIAGNOSIS — W07XXXA Fall from chair, initial encounter: Secondary | ICD-10-CM | POA: Insufficient documentation

## 2023-11-20 MED ORDER — TETANUS-DIPHTH-ACELL PERTUSSIS 5-2.5-18.5 LF-MCG/0.5 IM SUSY
0.5000 mL | PREFILLED_SYRINGE | Freq: Once | INTRAMUSCULAR | Status: AC
  Administered 2023-11-20: 0.5 mL via INTRAMUSCULAR
  Filled 2023-11-20: qty 0.5

## 2023-11-20 MED ORDER — LIDOCAINE-EPINEPHRINE-TETRACAINE (LET) TOPICAL GEL
3.0000 mL | Freq: Once | TOPICAL | Status: AC
  Administered 2023-11-20: 3 mL via TOPICAL
  Filled 2023-11-20: qty 3

## 2023-11-20 NOTE — ED Triage Notes (Signed)
 Pt here after slipping out of a chair while playing with her dog and states she hit the back of her head on a TV stand. Pt with laceration to posterior scalp that is oozing blood. Pt denies LOC. Pt denies being on blood thinners.

## 2023-11-20 NOTE — ED Provider Notes (Signed)
  EMERGENCY DEPARTMENT AT [facility] Provider Note   CSN: [id number] Arrival date & time: 11/20/23  9966     Patient presents with: [place]   Teresa [name] is a 73 y.o. female.   The history is provided by the patient.  Fall This is a new problem. The problem occurs constantly. Associated symptoms include headaches. Pertinent negatives include no chest pain and no abdominal pain. Nothing relieves the symptoms.  Patient with h/o HTN presents for accidental head injury Patient was sitting in an office chair when she slipped and fell backward striking her head.  No LOC but has a headache.  No vomiting.  She is not on anticoagulation.  She does have a laceration.  No neck or back pain    Past Medical History:  Diagnosis Date   Arthritis    Depression    Disc disorder    Fibromyalgia    GERD (gastroesophageal reflux disease)    High cholesterol    Hypertension    Insomnia     Prior to Admission medications   Medication Sig Start Date End Date Taking? Authorizing Provider  buPROPion (WELLBUTRIN SR) 150 MG 12 hr tablet Take 1 tablet by mouth 2 (two) times daily. 03/15/23   [provider]  DULoxetine (CYMBALTA) 60 MG capsule Take 60 mg by mouth daily.     [provider]  hydrALAZINE (APRESOLINE) 25 MG tablet Take by mouth. 04/01/23   [provider]  Multiple Vitamins-Minerals (MULTIVITAMINS THER. W/MINERALS) TABS Take 1 tablet by mouth every morning. One-A-Day for Women 50+    [provider]  ondansetron  (ZOFRAN -ODT) 4 MG disintegrating tablet Take 1 tablet (4 mg total) by mouth every 8 (eight) hours as needed. 10/17/23   Leath-Warren, Etta PARAS, NP  pantoprazole  (PROTONIX ) 40 MG tablet Take 1 tablet (40 mg total) by mouth 2 (two) times daily before a meal. Patient taking differently: Take 40 mg by mouth daily before breakfast. 09/01/16   Rehman, Claudis PENNER, MD  pravastatin (PRAVACHOL) 80 MG tablet Take 80 mg by mouth at bedtime.     [provider]  PREMARIN vaginal cream Place 1 Applicatorful vaginally 3 (three) times a week.  08/04/17   [provider]  Simethicone  (PHAZYME) 180 MG CAPS Take 1 capsule (180 mg total) by mouth 2 (two) times daily. Patient taking differently: Take 180 mg by mouth 2 (two) times daily as needed (flatulence). 09/01/16   Rehman, Claudis PENNER, MD  temazepam (RESTORIL) 22.5 MG capsule Take 22.5 mg by mouth at bedtime. 09/12/17   [provider]  VITAMIN D PO Take 2,000 Units by mouth daily.    [provider]    Allergies: Chicken allergy, Meat extract, Milk (cow), Milk-related compounds, Almond oil, Lactose, Lactose intolerance (gi), Mixed feathers, Latex, and Lisinopril    Review of Systems  Cardiovascular:  Negative for chest pain.  Gastrointestinal:  Negative for abdominal pain.  Musculoskeletal:  Negative for back pain and neck pain.  Neurological:  Positive for headaches.    Updated Vital Signs BP (!) 154/68 (BP Location: Right Arm)   Pulse 91   Temp 98.6 F (37 C) (Oral)   Resp 18   Ht 1.702 m (5' 7)   Wt 67.1 kg   SpO2 95%   BMI 23.18 kg/m   Physical Exam CONSTITUTIONAL: Well developed/well nourished, anxious HEAD: 1 cm laceration to posterior scalp, no active bleeding No other signs of trauma EYES: EOMI/PERRL ENMT: Mucous membranes moist No visible  facial/nasal trauma NECK: supple no meningeal signs SPINE/BACK:entire spine nontender No bruising/crepitance/stepoffs noted to spine CV: S1/S2 noted, no murmurs/rubs/gallops noted LUNGS: Lungs are clear to auscultation bilaterally, no apparent distress ABDOMEN: soft, nontender NEURO: Pt is awake/alert/appropriate, moves all extremitiesx4.  No facial droop.   EXTREMITIES: pulses normal/equal, full ROM, no deformities SKIN: warm, color normal PSYCH: Anxious (all labs ordered are listed, but only abnormal results are displayed) Labs Reviewed - No data to  display  EKG: None  Radiology: CT Head Wo Contrast Result Date: 11/20/2023 CLINICAL DATA:  Head trauma, moderate-severe, scalp laceration EXAM: CT HEAD WITHOUT CONTRAST TECHNIQUE: Contiguous axial images were obtained from the base of the skull through the vertex without intravenous contrast. RADIATION DOSE REDUCTION: This exam was performed according to the departmental dose-optimization program which includes automated exposure control, adjustment of the mA and/or kV according to patient size and/or use of iterative reconstruction technique. COMPARISON:  None Available. FINDINGS: Brain: Normal anatomic configuration. No abnormal intra or extra-axial mass lesion or fluid collection. No abnormal mass effect or midline shift. No evidence of acute intracranial hemorrhage or infarct. Ventricular size is normal. Cerebellum unremarkable. Vascular: Unremarkable Skull: Intact Sinuses/Orbits: Paranasal sinuses are clear. Orbits are unremarkable. Other: Mastoid air cells and middle ear cavities are clear. Mild soft tissue swelling and subcutaneous gas involving the occipital scalp. IMPRESSION: 1. No acute intracranial abnormality. No calvarial fracture. 2. Mild soft tissue swelling and subcutaneous gas involving the occipital scalp in keeping with given history of scalp laceration. Electronically Signed   By: Dorethia Molt M.D.   On: 11/20/2023 02:13   CT ABDOMEN PELVIS WO CONTRAST Result Date: 11/19/2023 EXAM: CT ABDOMEN AND PELVIS WITHOUT CONTRAST 11/19/2023 02:15:22 PM TECHNIQUE: CT of the abdomen and pelvis was performed without the administration of intravenous contrast. Multiplanar reformatted images are provided for review. Automated exposure control, iterative reconstruction, and/or weight based adjustment of the mA/kV was utilized to reduce the radiation dose to as low as reasonably achievable. COMPARISON: 12/09/2017 CLINICAL HISTORY: Patient for f/u micro hematuria; Lower back pain x 1 month FINDINGS:  LOWER CHEST: Moderate hiatal hernia involving gastric fundus. LIVER: Stable 1.5 cm cyst in hepatic segment 2. GALLBLADDER AND BILE DUCTS: Gallbladder is unremarkable. No biliary ductal dilatation. SPLEEN: No acute abnormality. PANCREAS: No acute abnormality. ADRENAL GLANDS: No acute abnormality. KIDNEYS, URETERS AND BLADDER: 9 mm proximal right ureteral calculus with mild right hydronephrosis. Significant interval progressive right renal parenchymal atrophy. 3 mm calculus in the lower pole left renal collecting system without hydronephrosis. Renal cortical cyst in the anterior left kidney as before. Urinary bladder is nondistended. GI AND BOWEL: Normal appendix. PERITONEUM AND RETROPERITONEUM: No ascites. No free air. VASCULATURE: Aorta is normal in caliber. LYMPH NODES: No lymphadenopathy. REPRODUCTIVE ORGANS: Uterine fundal calcification, presumably due to degenerative fibroids. BONES AND SOFT TISSUES: Subacute compression fracture deformity of T12 with at least 50% loss of height anteriorly, 2 to 3 mm retropulsion. Multilevel lumbar spondylitic change with grade 1 anterolisthesis L5 to S1, presumably secondary to Facet Djd. Mild lumbar dextroscoliosis. IMPRESSION: 1. Subacute compression fracture deformity of T12 with at least 50% loss of height anteriorly and 2 to 3 mm retropulsion. 2. 9 mm proximal right ureteral calculus with mild right hydronephrosis. 3. Significant interval progressive right renal parenchymal atrophy. 4. 3 mm calculus in the lower pole left renal collecting system without hydronephrosis. Electronically signed by: Dayne Hassell MD 11/19/2023 02:52 PM EDT RP Workstation: [id number]     .Laceration Repair  Date/Time: 11/20/2023 2:30 AM  Performed by: Midge Golas, MD Authorized by: Midge Golas, MD   Consent:    Consent obtained:  Verbal   Consent given by:  Patient Universal protocol:    Patient identity confirmed:  Provided demographic data Anesthesia:    Anesthesia  method:  Topical application   Topical anesthetic:  LET Laceration details:    Location:  Scalp   Length (cm):  1 Exploration:    Contaminated: no   Treatment:    Amount of cleaning:  Standard Skin repair:    Repair method:  Staples   Number of staples:  2 Approximation:    Approximation:  Close Repair type:    Repair type:  Simple Post-procedure details:    Procedure completion:  Tolerated well, no immediate complications    Medications Ordered in the ED  lidocaine -EPINEPHrine -tetracaine  (LET) topical gel (3 mLs Topical Given 11/20/23 0147)  Tdap (BOOSTRIX ) injection 0.5 mL (0.5 mLs Intramuscular Given 11/20/23 0139)               Glasgow Coma Scale Score: 15       NEXUS Criteria Score: 0                Medical Decision Making Amount and/or Complexity of Data Reviewed Radiology: ordered.  Risk Prescription drug management.   This patient presents to the ED for concern of head injury, this involves an extensive number of treatment options, and is a complaint that carries with it a high risk of complications and morbidity.  The differential diagnosis includes but is not limited to subdural hematoma, subarachnoid hemorrhage, skull fracture, concussion   Comorbidities that complicate the patient evaluation: Patient's presentation is complicated by their history of hypertension  Additional history obtained: Additional history obtained from family  Imaging Studies ordered: I ordered imaging studies including CT scan head  I independently visualized and interpreted imaging which showed no acute findings I agree with the radiologist interpretation  Medicines ordered and prescription drug management: Patient declines pain meds  Test Considered: CT C-spine was considered, but she has no focal tenderness and Nexus criteria has been met  Reevaluation: After the interventions noted above, I reevaluated the patient and found that they have :improved  Complexity of  problems addressed: Patient's presentation is most consistent with  acute presentation with potential threat to life or bodily function  Disposition: After consideration of the diagnostic results and the patient's response to treatment,  I feel that the patent would benefit from discharge  .    Patient presents after accidental fall striking her head.  She had small laceration that was repaired.  CT head negative.  No other signs of acute traumatic injury.  Incidentally patient just had CT abdomen pelvis yesterday but is already aware of those findings and currently being worked up as an outpatient    Final diagnoses:  Fall, initial encounter  Concussion without loss of consciousness, initial encounter  Laceration of scalp, initial encounter    ED Discharge Orders     None          Midge Golas, MD 11/20/23 [phone]

## 2023-11-26 ENCOUNTER — Ambulatory Visit: Admission: EM | Admit: 2023-11-26 | Discharge: 2023-11-26 | Disposition: A | Discharge status: Home / Self Care

## 2023-11-26 DIAGNOSIS — Z4802 Encounter for removal of sutures: Secondary | ICD-10-CM | POA: Diagnosis not present

## 2023-11-26 DIAGNOSIS — S0101XD Laceration without foreign body of scalp, subsequent encounter: Secondary | ICD-10-CM | POA: Diagnosis not present

## 2023-11-26 NOTE — Discharge Instructions (Signed)
 2 sutures were removed from the laceration in your scalp.  Cleanse the area with warm water , do not vigorously scrub the area for the next 7 days. You may continue with over-the-counter Tylenol as needed for pain or discomfort. Apply cool compresses to the area to help with pain or discomfort. Follow-up as needed.

## 2023-11-26 NOTE — ED Triage Notes (Signed)
 Pt reports needing staple removal had them placed a week ago. After a fall. Still feels a little irritating

## 2023-11-26 NOTE — ED Notes (Signed)
 Pt declined paperwork today, after staple removal and has left.

## 2023-11-26 NOTE — ED Provider Notes (Signed)
 [facility]    CSN: [id number] Arrival date & time: 11/26/23  1332      History   Chief Complaint No chief complaint on file.   HPI Teresa [name] is a 73 y.o. female.   The history is provided by the patient.   Patient presents for staple removal to the occipital region of her head.  Patient states the staples were placed on 11/20/2023.  Patient states that the area is still tender to touch.  She denies fever, chills, bowel swelling drainage from the site, or continued headache.  Patient reports that her tetanus was updated at the time the staples were placed.  Past Medical History:  Diagnosis Date   Arthritis    Depression    Disc disorder    Fibromyalgia    GERD (gastroesophageal reflux disease)    High cholesterol    Hypertension    Insomnia     Patient Active Problem List   Diagnosis Date Noted   Insomnia 01/07/2023   Osteopenia 01/07/2023   Allergic reaction 01/07/2023   Chronic fatigue syndrome 02/19/2022   Prediabetes 02/18/2022   Hyperglycemia due to type 2 diabetes mellitus (HCC) 12/31/2021   Chronic kidney disease, stage 3a (HCC) 08/03/2021   Fibromyalgia 08/03/2021   Essential hypertension 10/17/2020   Attention deficit hyperactivity disorder 10/17/2020   Burning mouth syndrome 10/17/2020   Major depressive disorder 09/16/2020   Constipation 11/14/2018   Special screening for malignant neoplasms, colon 09/16/2017   Arthritis of carpometacarpal [facility]) joint of right thumb 10/05/2016   Multinodular goiter 02/10/2016   Shoulder impingement syndrome 02/26/2014   Nevus, non-neoplastic 07/01/2011   GERD (gastroesophageal reflux disease) 05/21/2011   RUPTURE ROTATOR CUFF 07/18/2009   SHOULDER PAIN 07/01/2009   IMPINGEMENT SYNDROME 07/01/2009   SUPERIOR GLENOID LABRUM TEAR 07/01/2009   HYPERLIPIDEMIA 05/31/2009   TMJ PAIN 05/31/2009   FIBROMYALGIA 05/31/2009   FATIGUE 05/31/2009   Symptoms involving cardiovascular system 05/31/2009   DYSPNEA  05/31/2009   Chest pain, unspecified 05/31/2009    Past Surgical History:  Procedure Laterality Date   BUNIONECTOMY Bilateral    COLONOSCOPY WITH PROPOFOL  N/A 12/31/2017   Procedure: COLONOSCOPY WITH PROPOFOL ;  Surgeon: Golda Claudis PENNER, MD;  [facility];  Service: Endoscopy;  Laterality: N/A;  7:30   FACIAL COSMETIC SURGERY     POLYPECTOMY  12/31/2017   Procedure: POLYPECTOMY;  Surgeon: Golda Claudis PENNER, MD;  [facility]: [facility];  Service: Endoscopy;;  Transverse colon (CBx1)   TONSILLECTOMY      OB History   No obstetric history on file.      Home Medications    Prior to Admission medications   Medication Sig Start Date End Date Taking? Authorizing Provider  buPROPion (WELLBUTRIN SR) 150 MG 12 hr tablet Take 1 tablet by mouth 2 (two) times daily. 03/15/23   [provider]  DULoxetine (CYMBALTA) 60 MG capsule Take 60 mg by mouth daily.     [provider]  hydrALAZINE (APRESOLINE) 25 MG tablet Take by mouth. 04/01/23   [provider]  Multiple Vitamins-Minerals (MULTIVITAMINS THER. W/MINERALS) TABS Take 1 tablet by mouth every morning. One-A-Day for Women 50+    [provider]  ondansetron  (ZOFRAN -ODT) 4 MG disintegrating tablet Take 1 tablet (4 mg total) by mouth every 8 (eight) hours as needed. 10/17/23   Leath-Warren, Etta PARAS, NP  pantoprazole  (PROTONIX ) 40 MG tablet Take 1 tablet (40 mg total) by mouth 2 (two) times daily before a meal. Patient  taking differently: Take 40 mg by mouth daily before breakfast. 09/01/16   Rehman, Claudis PENNER, MD  pravastatin (PRAVACHOL) 80 MG tablet Take 80 mg by mouth at bedtime.    [provider]  PREMARIN vaginal cream Place 1 Applicatorful vaginally 3 (three) times a week.  08/04/17   [provider]  Simethicone  (PHAZYME) 180 MG CAPS Take 1 capsule (180 mg total) by mouth 2 (two) times daily. Patient taking differently: Take 180 mg by mouth 2 (two) times daily as needed  (flatulence). 09/01/16   Rehman, Claudis PENNER, MD  temazepam (RESTORIL) 22.5 MG capsule Take 22.5 mg by mouth at bedtime. 09/12/17   [provider]  VITAMIN D PO Take 2,000 Units by mouth daily.    [provider]    Family History Family History  Problem Relation Age of Onset   Diabetes Mother    Stroke Mother    Kidney disease Mother    Depression Mother    Heart disease Mother    Hypertension Mother    Clotting disorder Mother    Arthritis Mother    Osteoporosis Mother    Ulcers Father    GER disease Father    Alcohol abuse Father    Diabetes Sister    Hypertension Sister    Arthritis Sister    Asthma Maternal Grandmother    Colon cancer Neg Hx     Social History Social History   Tobacco Use   Smoking status: Never   Smokeless tobacco: Never  Vaping Use   Vaping status: Never Used  Substance Use Topics   Alcohol use: No    Alcohol/week: 0.0 standard drinks of alcohol   Drug use: No     Allergies   Chicken allergy, Meat extract, Milk (cow), Milk-related compounds, Almond oil, Lactose, Lactose intolerance (gi), Mixed feathers, Latex, and Lisinopril   Review of Systems Review of Systems Per HPI  Physical Exam Triage Vital Signs ED Triage Vitals [11/26/23 1449]  Encounter Vitals Group     BP 134/78     Girls Systolic BP Percentile      Girls Diastolic BP Percentile      Boys Systolic BP Percentile      Boys Diastolic BP Percentile      Pulse Rate 78     Resp 20     Temp 98.1 F (36.7 C)     Temp Source Oral     SpO2 98 %     Weight      Height      Head Circumference      Peak Flow      Pain Score 0     Pain Loc      Pain Education      Exclude from Growth Chart    No data found.  Updated Vital Signs BP 134/78 (BP Location: Right Arm)   Pulse 78   Temp 98.1 F (36.7 C) (Oral)   Resp 20   SpO2 98%   Visual Acuity Right Eye Distance:   Left Eye Distance:   Bilateral Distance:    Right Eye Near:   Left Eye Near:     Bilateral Near:     Physical Exam Vitals and nursing note reviewed.  Constitutional:      General: She is not in acute distress.    Appearance: Normal appearance.  HENT:     Head: Normocephalic.  Eyes:     Extraocular Movements: Extraocular movements intact.     Pupils: Pupils  are equal, round, and reactive to light.  Pulmonary:     Effort: Pulmonary effort is normal.  Musculoskeletal:     Cervical back: Normal range of motion.  Skin:    General: Skin is warm and dry.     Findings: Laceration present.      Neurological:     General: No focal deficit present.     Mental Status: She is alert and oriented to person, place, and time.  Psychiatric:        Mood and Affect: Mood normal.        Behavior: Behavior normal.      UC Treatments / Results  Labs (all labs ordered are listed, but only abnormal results are displayed) Labs Reviewed - No data to display  EKG   Radiology No results found.  Procedures Procedures (including critical care time)  Medications Ordered in UC Medications - No data to display  Initial Impression / Assessment and Plan / UC Course  I have reviewed the triage vital signs and the nursing notes.  Pertinent labs & imaging results that were available during my care of the patient were reviewed by me and considered in my medical decision making (see chart for details).  2 staples removed from the occipital region of the patient's scalp.  There is no obvious foul-smelling drainage, warmth, or swelling noted to the laceration.  Supportive care recommendations were provided and discussed with the patient to include cleansing the area with warm water , over-the-counter analgesics, and applying ice as needed.  Discussed indications with patient regarding follow-up.  Patient was in agreement with this plan of care and verbalizes understanding.  All questions were answered.  Patient stable for discharge.   Final Clinical Impressions(s) / UC Diagnoses    Final diagnoses:  None   Discharge Instructions   None    ED Prescriptions   None    PDMP not reviewed this encounter.   Gilmer Etta PARAS, NP 11/26/23 [phone]

## 2023-12-06 ENCOUNTER — Ambulatory Visit: Admitting: Urology

## 2023-12-06 ENCOUNTER — Other Ambulatory Visit: Payer: Self-pay

## 2023-12-06 ENCOUNTER — Encounter: Payer: Self-pay | Admitting: Urology

## 2023-12-06 ENCOUNTER — Ambulatory Visit (HOSPITAL_COMMUNITY)
Admission: RE | Admit: 2023-12-06 | Discharge: 2023-12-06 | Disposition: A | Discharge status: Home / Self Care | Source: Ambulatory Visit | Attending: Urology | Admitting: Urology

## 2023-12-06 VITALS — BP 117/57 | HR 85

## 2023-12-06 DIAGNOSIS — N200 Calculus of kidney: Secondary | ICD-10-CM | POA: Insufficient documentation

## 2023-12-06 DIAGNOSIS — I878 Other specified disorders of veins: Secondary | ICD-10-CM | POA: Diagnosis not present

## 2023-12-06 LAB — URINALYSIS, ROUTINE W REFLEX MICROSCOPIC
Bilirubin, UA: NEGATIVE
Glucose, UA: NEGATIVE
Ketones, UA: NEGATIVE
Nitrite, UA: NEGATIVE
Protein,UA: NEGATIVE
RBC, UA: NEGATIVE
Specific Gravity, UA: 1.015 (ref 1.005–1.030)
Urobilinogen, Ur: 0.2 mg/dL (ref 0.2–1.0)
pH, UA: 6 (ref 5.0–7.5)

## 2023-12-06 LAB — MICROSCOPIC EXAMINATION: Bacteria, UA: NONE SEEN

## 2023-12-06 NOTE — Patient Instructions (Signed)

## 2023-12-06 NOTE — Progress Notes (Signed)
 12/06/2023 10:08 AM   Teresa [name] March 08, 1951 [id number]  Referring provider: Shona Norleen PEDLAR, MD [address] [name],  [place] [address]  nephrolithiasis   HPI: Teresa [name] is a 73yo here for evaluation of nephrolithiasis. She developed worsening right flank pain and underwent CT on 11/19/2023 which showed a 9mm right proximal ureteral calculus which significant right renal atrophy. Normal left kidney. She had a stone event 30 years ago. She had a CT in 2019 which showed a normal right kidney and a 9mm right lower pole calculus. Her sisters and niece have a hx of nephrolithiasis   PMH: Past Medical History:  Diagnosis Date   Arthritis    Depression    Disc disorder    Fibromyalgia    GERD (gastroesophageal reflux disease)    High cholesterol    Hypertension    Insomnia     Surgical History: Past Surgical History:  Procedure Laterality Date   BUNIONECTOMY Bilateral    COLONOSCOPY WITH PROPOFOL  N/A 12/31/2017   Procedure: COLONOSCOPY WITH PROPOFOL ;  Surgeon: Golda Claudis PENNER, MD;  [facility];  Service: Endoscopy;  Laterality: N/A;  7:30   FACIAL COSMETIC SURGERY     POLYPECTOMY  12/31/2017   Procedure: POLYPECTOMY;  Surgeon: Golda Claudis PENNER, MD;  [facility];  Service: Endoscopy;;  Transverse colon (CBx1)   TONSILLECTOMY      Home Medications:  Allergies as of 12/06/2023       Reactions   Chicken Allergy Anaphylaxis   Any meat at all  BITTEN BY TICK-DEVELOPED MAMMAL MEAT ALLERGY   Meat Extract Anaphylaxis   Any meat at all BITTEN BY TICK-DEVELOPED MAMMAL MEAT ALLERGY    Milk (cow) Other (See Comments)   All Diary Products due to the [employer] Allergy - Bitten by Tick   Milk-related Compounds Other (See Comments)   All Diary Products due to the [employer] Allergy - Bitten by Tick   Almond Oil Other (See Comments)   Lactose Other (See Comments)   Gi upset   Lactose Intolerance (gi) Other (See Comments)   Gi upset   Mixed Feathers  Other (See Comments)   Latex Rash   Lisinopril Rash        Medication List        Accurate as of December 06, 2023 10:08 AM. If you have any questions, ask your nurse or doctor.          buPROPion 150 MG 12 hr tablet Commonly known as: WELLBUTRIN SR Take 1 tablet by mouth 2 (two) times daily.   DULoxetine 60 MG capsule Commonly known as: CYMBALTA Take 60 mg by mouth daily.   hydrALAZINE 25 MG tablet Commonly known as: APRESOLINE Take by mouth.   multivitamins ther. w/minerals Tabs tablet Take 1 tablet by mouth every morning. One-A-Day for Women 50+   ondansetron  4 MG disintegrating tablet Commonly known as: ZOFRAN -ODT Take 1 tablet (4 mg total) by mouth every 8 (eight) hours as needed.   pantoprazole  40 MG tablet Commonly known as: PROTONIX  Take 1 tablet (40 mg total) by mouth 2 (two) times daily before a meal. What changed: when to take this   pravastatin 80 MG tablet Commonly known as: PRAVACHOL Take 80 mg by mouth at bedtime.   Premarin vaginal cream Generic drug: conjugated estrogens Place 1 Applicatorful vaginally 3 (three) times a week.   Simethicone  180 MG Caps Commonly known as: Phazyme Take 1 capsule (180 mg total) by mouth  2 (two) times daily. What changed:  when to take this reasons to take this   temazepam 22.5 MG capsule Commonly known as: RESTORIL Take 22.5 mg by mouth at bedtime.   VITAMIN D PO Take 2,000 Units by mouth daily.        Allergies:  Allergies  Allergen Reactions   Chicken Allergy Anaphylaxis    Any meat at all  BITTEN BY TICK-DEVELOPED MAMMAL MEAT ALLERGY   Meat Extract Anaphylaxis    Any meat at all BITTEN BY TICK-DEVELOPED MAMMAL MEAT ALLERGY    Milk (Cow) Other (See Comments)    All Diary Products due to the [employer] Allergy - Bitten by Tick   Milk-Related Compounds Other (See Comments)    All Diary Products due to the [employer] Allergy - Bitten by Tick   Almond Oil Other (See Comments)   Lactose Other  (See Comments)    Gi upset   Lactose Intolerance (Gi) Other (See Comments)    Gi upset   Mixed Feathers Other (See Comments)   Latex Rash   Lisinopril Rash    Family History: Family History  Problem Relation Age of Onset   Diabetes Mother    Stroke Mother    Kidney disease Mother    Depression Mother    Heart disease Mother    Hypertension Mother    Clotting disorder Mother    Arthritis Mother    Osteoporosis Mother    Ulcers Father    GER disease Father    Alcohol abuse Father    Diabetes Sister    Hypertension Sister    Arthritis Sister    Asthma Maternal Grandmother    Colon cancer Neg Hx     Social History:  reports that she has never smoked. She has never used smokeless tobacco. She reports that she does not drink alcohol and does not use drugs.  ROS: All other review of systems were reviewed and are negative except what is noted above in HPI  Physical Exam: BP (!) 117/57   Pulse 85   Constitutional:  Alert and oriented, No acute distress. HEENT: [place] AT, moist mucus membranes.  Trachea midline, no masses. Cardiovascular: No clubbing, cyanosis, or edema. Respiratory: Normal respiratory effort, no increased work of breathing. GI: Abdomen is soft, nontender, nondistended, no abdominal masses GU: No CVA tenderness.  Lymph: No cervical or inguinal lymphadenopathy. Skin: No rashes, bruises or suspicious lesions. Neurologic: Grossly intact, no focal deficits, moving all 4 extremities. Psychiatric: Normal mood and affect.  Laboratory Data: Lab Results  Component Value Date   WBC 5.2 01/26/2019   HGB 13.0 12/24/2017   HCT 38 01/26/2019   MCV 91 01/26/2019   PLT 213 12/24/2017    Lab Results  Component Value Date   CREATININE 0.88 12/24/2017    No results found for: PSA  No results found for: TESTOSTERONE  Lab Results  Component Value Date   HGBA1C 5.7 03/30/2013    Urinalysis    Component Value Date/Time   BILIRUBINUR moderate (A) 10/17/2023  0826   KETONESUR trace (5) (A) 10/17/2023 0826   PROTEINUR >=300 (A) 10/17/2023 0826   UROBILINOGEN 1.0 10/17/2023 0826   NITRITE Positive (A) 10/17/2023 0826   LEUKOCYTESUR Large (3+) (A) 10/17/2023 0826    No results found for: LABMICR, WBCUA, RBCUA, LABEPIT, MUCUS, BACTERIA  Pertinent Imaging: Ct 11/19/2023 and KUB today: Images reviewed and discussed with the patient  Results for orders placed during the hospital encounter of 03/10/17  DG  Abd 1 View  Narrative CLINICAL DATA:  Right upper quadrant pain.  Hematuria.  EXAM: ABDOMEN - 1 VIEW  COMPARISON:  Spine radiograph 02/29/2016  FINDINGS: The bowel gas pattern is normal. Large amount of formed stool throughout the colon. 9 mm calculus seen in the right upper quadrant of the abdomen. I was present on the spine radiograph dated 02/29/2016.  IMPRESSION: Nonobstructive bowel gas pattern.  9 mm calculus overlies the right upper quadrant of the abdomen. It may be located within the right kidney, right proximal ureter, or potentially within gallbladder/biliary System.   Electronically Signed By: Dobrinka  Dimitrova M.D. On: 03/10/2017 22:06  No results found for this or any previous visit.  No results found for this or any previous visit.  No results found for this or any previous visit.  Results for orders placed during the hospital encounter of 10/26/05  US  Renal  Narrative History: Renal artery stenosis  RENAL ULTRASOUND:  Kidneys measure 11.2 cm length right and 10.9 cm length left. Normal renal cortical thickness and echogenicity bilaterally. No hydronephrosis, renal mass, or shadowing calcification. No perinephric fluid collections. Bladder decompressed, containing only minimal urine.  IMPRESSION: Normal renal ultrasound.  Provider: Alan Greenhouse  No results found for this or any previous visit.  No results found for this or any previous visit.  No results found for this or any  previous visit.   Assessment & Plan:    1. Kidney stones (Primary) -We discussed the management of kidney stones. These options include observation, ureteroscopy, shockwave lithotripsy (ESWL) and percutaneous nephrolithotomy (PCNL). We discussed which options are relevant to the patient's stone(s). We discussed the natural history of kidney stones as well as the complications of untreated stones and the impact on quality of life without treatment as well as with each of the above listed treatments. We also discussed the efficacy of each treatment in its ability to clear the stone burden. With any of these management options I discussed the signs and symptoms of infection and the need for emergent treatment should these be experienced. For each option we discussed the ability of each procedure to clear the patient of their stone burden.   For observation I described the risks which include but are not limited to silent renal damage, life-threatening infection, need for emergent surgery, failure to pass stone and pain.   For ureteroscopy I described the risks which include bleeding, infection, damage to contiguous structures, positioning injury, ureteral stricture, ureteral avulsion, ureteral injury, need for prolonged ureteral stent, inability to perform ureteroscopy, need for an interval procedure, inability to clear stone burden, stent discomfort/pain, heart attack, stroke, pulmonary embolus and the inherent risks with general anesthesia.   For shockwave lithotripsy I described the risks which include arrhythmia, kidney contusion, kidney hemorrhage, need for transfusion, pain, inability to adequately break up stone, inability to pass stone fragments, Steinstrasse, infection associated with obstructing stones, need for alternate surgical procedure, need for repeat shockwave lithotripsy, MI, CVA, PE and the inherent risks with anesthesia/conscious sedation.   For PCNL I described the risks including  positioning injury, pneumothorax, hydrothorax, need for chest tube, inability to clear stone burden, renal laceration, arterial venous fistula or malformation, need for embolization of kidney, loss of kidney or renal function, need for repeat procedure, need for prolonged nephrostomy tube, ureteral avulsion, MI, CVA, PE and the inherent risks of general anesthesia.   - The patient would like to proceed with right ESWL  - Urinalysis, Routine w reflex microscopic   No  follow-ups on file.  [name], MD  [facility] [place]

## 2023-12-06 NOTE — H&P (View-Only) (Signed)
 12/06/2023 10:08 AM   Rock GORMAN Skeen March 08, 1951 [id number]  Referring provider: Shona Norleen PEDLAR, MD [address] [name],  [place] [address]  nephrolithiasis   HPI: Ms Schnider is a 73yo here for evaluation of nephrolithiasis. She developed worsening right flank pain and underwent CT on 11/19/2023 which showed a 9mm right proximal ureteral calculus which significant right renal atrophy. Normal left kidney. She had a stone event 30 years ago. She had a CT in 2019 which showed a normal right kidney and a 9mm right lower pole calculus. Her sisters and niece have a hx of nephrolithiasis   PMH: Past Medical History:  Diagnosis Date   Arthritis    Depression    Disc disorder    Fibromyalgia    GERD (gastroesophageal reflux disease)    High cholesterol    Hypertension    Insomnia     Surgical History: Past Surgical History:  Procedure Laterality Date   BUNIONECTOMY Bilateral    COLONOSCOPY WITH PROPOFOL  N/A 12/31/2017   Procedure: COLONOSCOPY WITH PROPOFOL ;  Surgeon: Golda Claudis PENNER, MD;  [facility];  Service: Endoscopy;  Laterality: N/A;  7:30   FACIAL COSMETIC SURGERY     POLYPECTOMY  12/31/2017   Procedure: POLYPECTOMY;  Surgeon: Golda Claudis PENNER, MD;  [facility];  Service: Endoscopy;;  Transverse colon (CBx1)   TONSILLECTOMY      Home Medications:  Allergies as of 12/06/2023       Reactions   Chicken Allergy Anaphylaxis   Any meat at all  BITTEN BY TICK-DEVELOPED MAMMAL MEAT ALLERGY   Meat Extract Anaphylaxis   Any meat at all BITTEN BY TICK-DEVELOPED MAMMAL MEAT ALLERGY    Milk (cow) Other (See Comments)   All Diary Products due to the [employer] Allergy - Bitten by Tick   Milk-related Compounds Other (See Comments)   All Diary Products due to the [employer] Allergy - Bitten by Tick   Almond Oil Other (See Comments)   Lactose Other (See Comments)   Gi upset   Lactose Intolerance (gi) Other (See Comments)   Gi upset   Mixed Feathers  Other (See Comments)   Latex Rash   Lisinopril Rash        Medication List        Accurate as of December 06, 2023 10:08 AM. If you have any questions, ask your nurse or doctor.          buPROPion 150 MG 12 hr tablet Commonly known as: WELLBUTRIN SR Take 1 tablet by mouth 2 (two) times daily.   DULoxetine 60 MG capsule Commonly known as: CYMBALTA Take 60 mg by mouth daily.   hydrALAZINE 25 MG tablet Commonly known as: APRESOLINE Take by mouth.   multivitamins ther. w/minerals Tabs tablet Take 1 tablet by mouth every morning. One-A-Day for Women 50+   ondansetron  4 MG disintegrating tablet Commonly known as: ZOFRAN -ODT Take 1 tablet (4 mg total) by mouth every 8 (eight) hours as needed.   pantoprazole  40 MG tablet Commonly known as: PROTONIX  Take 1 tablet (40 mg total) by mouth 2 (two) times daily before a meal. What changed: when to take this   pravastatin 80 MG tablet Commonly known as: PRAVACHOL Take 80 mg by mouth at bedtime.   Premarin vaginal cream Generic drug: conjugated estrogens Place 1 Applicatorful vaginally 3 (three) times a week.   Simethicone  180 MG Caps Commonly known as: Phazyme Take 1 capsule (180 mg total) by mouth  2 (two) times daily. What changed:  when to take this reasons to take this   temazepam 22.5 MG capsule Commonly known as: RESTORIL Take 22.5 mg by mouth at bedtime.   VITAMIN D PO Take 2,000 Units by mouth daily.        Allergies:  Allergies  Allergen Reactions   Chicken Allergy Anaphylaxis    Any meat at all  BITTEN BY TICK-DEVELOPED MAMMAL MEAT ALLERGY   Meat Extract Anaphylaxis    Any meat at all BITTEN BY TICK-DEVELOPED MAMMAL MEAT ALLERGY    Milk (Cow) Other (See Comments)    All Diary Products due to the [employer] Allergy - Bitten by Tick   Milk-Related Compounds Other (See Comments)    All Diary Products due to the [employer] Allergy - Bitten by Tick   Almond Oil Other (See Comments)   Lactose Other  (See Comments)    Gi upset   Lactose Intolerance (Gi) Other (See Comments)    Gi upset   Mixed Feathers Other (See Comments)   Latex Rash   Lisinopril Rash    Family History: Family History  Problem Relation Age of Onset   Diabetes Mother    Stroke Mother    Kidney disease Mother    Depression Mother    Heart disease Mother    Hypertension Mother    Clotting disorder Mother    Arthritis Mother    Osteoporosis Mother    Ulcers Father    GER disease Father    Alcohol abuse Father    Diabetes Sister    Hypertension Sister    Arthritis Sister    Asthma Maternal Grandmother    Colon cancer Neg Hx     Social History:  reports that she has never smoked. She has never used smokeless tobacco. She reports that she does not drink alcohol and does not use drugs.  ROS: All other review of systems were reviewed and are negative except what is noted above in HPI  Physical Exam: BP (!) 117/57   Pulse 85   Constitutional:  Alert and oriented, No acute distress. HEENT: [place] AT, moist mucus membranes.  Trachea midline, no masses. Cardiovascular: No clubbing, cyanosis, or edema. Respiratory: Normal respiratory effort, no increased work of breathing. GI: Abdomen is soft, nontender, nondistended, no abdominal masses GU: No CVA tenderness.  Lymph: No cervical or inguinal lymphadenopathy. Skin: No rashes, bruises or suspicious lesions. Neurologic: Grossly intact, no focal deficits, moving all 4 extremities. Psychiatric: Normal mood and affect.  Laboratory Data: Lab Results  Component Value Date   WBC 5.2 01/26/2019   HGB 13.0 12/24/2017   HCT 38 01/26/2019   MCV 91 01/26/2019   PLT 213 12/24/2017    Lab Results  Component Value Date   CREATININE 0.88 12/24/2017    No results found for: PSA  No results found for: TESTOSTERONE  Lab Results  Component Value Date   HGBA1C 5.7 03/30/2013    Urinalysis    Component Value Date/Time   BILIRUBINUR moderate (A) 10/17/2023  0826   KETONESUR trace (5) (A) 10/17/2023 0826   PROTEINUR >=300 (A) 10/17/2023 0826   UROBILINOGEN 1.0 10/17/2023 0826   NITRITE Positive (A) 10/17/2023 0826   LEUKOCYTESUR Large (3+) (A) 10/17/2023 0826    No results found for: LABMICR, WBCUA, RBCUA, LABEPIT, MUCUS, BACTERIA  Pertinent Imaging: Ct 11/19/2023 and KUB today: Images reviewed and discussed with the patient  Results for orders placed during the hospital encounter of 03/10/17  DG  Abd 1 View  Narrative CLINICAL DATA:  Right upper quadrant pain.  Hematuria.  EXAM: ABDOMEN - 1 VIEW  COMPARISON:  Spine radiograph 02/29/2016  FINDINGS: The bowel gas pattern is normal. Large amount of formed stool throughout the colon. 9 mm calculus seen in the right upper quadrant of the abdomen. I was present on the spine radiograph dated 02/29/2016.  IMPRESSION: Nonobstructive bowel gas pattern.  9 mm calculus overlies the right upper quadrant of the abdomen. It may be located within the right kidney, right proximal ureter, or potentially within gallbladder/biliary System.   Electronically Signed By: Dobrinka  Dimitrova M.D. On: 03/10/2017 22:06  No results found for this or any previous visit.  No results found for this or any previous visit.  No results found for this or any previous visit.  Results for orders placed during the hospital encounter of 10/26/05  US  Renal  Narrative History: Renal artery stenosis  RENAL ULTRASOUND:  Kidneys measure 11.2 cm length right and 10.9 cm length left. Normal renal cortical thickness and echogenicity bilaterally. No hydronephrosis, renal mass, or shadowing calcification. No perinephric fluid collections. Bladder decompressed, containing only minimal urine.  IMPRESSION: Normal renal ultrasound.  Provider: Alan Greenhouse  No results found for this or any previous visit.  No results found for this or any previous visit.  No results found for this or any  previous visit.   Assessment & Plan:    1. Kidney stones (Primary) -We discussed the management of kidney stones. These options include observation, ureteroscopy, shockwave lithotripsy (ESWL) and percutaneous nephrolithotomy (PCNL). We discussed which options are relevant to the patient's stone(s). We discussed the natural history of kidney stones as well as the complications of untreated stones and the impact on quality of life without treatment as well as with each of the above listed treatments. We also discussed the efficacy of each treatment in its ability to clear the stone burden. With any of these management options I discussed the signs and symptoms of infection and the need for emergent treatment should these be experienced. For each option we discussed the ability of each procedure to clear the patient of their stone burden.   For observation I described the risks which include but are not limited to silent renal damage, life-threatening infection, need for emergent surgery, failure to pass stone and pain.   For ureteroscopy I described the risks which include bleeding, infection, damage to contiguous structures, positioning injury, ureteral stricture, ureteral avulsion, ureteral injury, need for prolonged ureteral stent, inability to perform ureteroscopy, need for an interval procedure, inability to clear stone burden, stent discomfort/pain, heart attack, stroke, pulmonary embolus and the inherent risks with general anesthesia.   For shockwave lithotripsy I described the risks which include arrhythmia, kidney contusion, kidney hemorrhage, need for transfusion, pain, inability to adequately break up stone, inability to pass stone fragments, Steinstrasse, infection associated with obstructing stones, need for alternate surgical procedure, need for repeat shockwave lithotripsy, MI, CVA, PE and the inherent risks with anesthesia/conscious sedation.   For PCNL I described the risks including  positioning injury, pneumothorax, hydrothorax, need for chest tube, inability to clear stone burden, renal laceration, arterial venous fistula or malformation, need for embolization of kidney, loss of kidney or renal function, need for repeat procedure, need for prolonged nephrostomy tube, ureteral avulsion, MI, CVA, PE and the inherent risks of general anesthesia.   - The patient would like to proceed with right ESWL  - Urinalysis, Routine w reflex microscopic   No  follow-ups on file.  [name], MD  [facility] [place]

## 2023-12-09 ENCOUNTER — Encounter (HOSPITAL_COMMUNITY)
Admission: RE | Admit: 2023-12-09 | Discharge: 2023-12-09 | Disposition: A | Discharge status: Home / Self Care | Source: Ambulatory Visit | Attending: Urology | Admitting: Urology

## 2023-12-10 NOTE — Progress Notes (Signed)
 Patient has been called several times for PAT appointment, no answers. Message left for patient to return this call. Also patient's emergency contact was called and message left to return this call. Patient is for procedure on 12/14/23.

## 2023-12-14 ENCOUNTER — Telehealth: Payer: Self-pay

## 2023-12-14 ENCOUNTER — Ambulatory Visit (HOSPITAL_COMMUNITY): Admission: RE | Admit: 2023-12-14 | Source: Home / Self Care | Admitting: Urology

## 2023-12-14 ENCOUNTER — Encounter (HOSPITAL_COMMUNITY): Admission: RE | Payer: Self-pay | Source: Home / Self Care

## 2023-12-14 DIAGNOSIS — N200 Calculus of kidney: Secondary | ICD-10-CM

## 2023-12-14 SURGERY — LITHOTRIPSY, ESWL
Anesthesia: LOCAL | Laterality: Right

## 2023-12-14 NOTE — Telephone Encounter (Signed)
 Pt rescheduled

## 2023-12-14 NOTE — Telephone Encounter (Signed)
 Patient states that she had tried to return the call to [facility] pre op with no answer and left a message with no returned call

## 2023-12-14 NOTE — Progress Notes (Signed)
 Pt called at home stated I didn't know what time the procedure was.   Was told to come and and we could do her she said, I have been up all night with an upset stomach, I need to reschedule.   Pt told to call dr office to reschedule, she verbalized understanding

## 2023-12-14 NOTE — Telephone Encounter (Signed)
 Can you move patient to 12/21/2023.  She states she never got a pre op call with details.  I did not get a message about her not having a preop.

## 2023-12-14 NOTE — Telephone Encounter (Signed)
 Patient called last week to find out time of lithotripsy. No call back. Took laxative yesterday. Was up most of the night with an upset stomach.  Did not know the time to arrive for lithotripsy today.  Would like to reschedule.  Please advise.  Call:  [phone]

## 2023-12-17 ENCOUNTER — Encounter (HOSPITAL_COMMUNITY)
Admission: RE | Admit: 2023-12-17 | Discharge: 2023-12-17 | Disposition: A | Discharge status: Home / Self Care | Source: Ambulatory Visit | Attending: Urology | Admitting: Urology

## 2023-12-21 ENCOUNTER — Ambulatory Visit (HOSPITAL_COMMUNITY)
Admission: RE | Admit: 2023-12-21 | Discharge: 2023-12-21 | Disposition: A | Discharge status: Home / Self Care | Attending: Urology | Admitting: Urology

## 2023-12-21 ENCOUNTER — Encounter (HOSPITAL_COMMUNITY)
Admission: RE | Disposition: A | Discharge status: Home / Self Care | Payer: Self-pay | Source: Home / Self Care | Attending: Urology

## 2023-12-21 ENCOUNTER — Ambulatory Visit (HOSPITAL_COMMUNITY)

## 2023-12-21 DIAGNOSIS — N201 Calculus of ureter: Secondary | ICD-10-CM | POA: Insufficient documentation

## 2023-12-21 DIAGNOSIS — N200 Calculus of kidney: Secondary | ICD-10-CM | POA: Diagnosis not present

## 2023-12-21 DIAGNOSIS — I10 Essential (primary) hypertension: Secondary | ICD-10-CM | POA: Diagnosis not present

## 2023-12-21 HISTORY — PX: EXTRACORPOREAL SHOCK WAVE LITHOTRIPSY: SHX1557

## 2023-12-21 SURGERY — LITHOTRIPSY, ESWL
Anesthesia: LOCAL | Laterality: Right

## 2023-12-21 MED ORDER — ONDANSETRON 4 MG PO TBDP: 4.0000 mg | ORAL_TABLET | Freq: Three times a day (TID) | ORAL | 0 refills | Status: DC | PRN

## 2023-12-21 MED ORDER — SODIUM CHLORIDE 0.9 % IV SOLN: INTRAVENOUS | Status: DC

## 2023-12-21 MED ORDER — TAMSULOSIN HCL 0.4 MG PO CAPS: 0.4000 mg | ORAL_CAPSULE | Freq: Every day | ORAL | 0 refills | Status: DC

## 2023-12-21 MED ORDER — OXYCODONE-ACETAMINOPHEN 5-325 MG PO TABS: 1.0000 | ORAL_TABLET | ORAL | 0 refills | Status: DC | PRN

## 2023-12-21 MED ORDER — DIPHENHYDRAMINE HCL 25 MG PO CAPS: 25.0000 mg | ORAL_CAPSULE | ORAL | Status: DC

## 2023-12-21 MED ORDER — DIPHENHYDRAMINE HCL 25 MG PO CAPS
25.0000 mg | ORAL_CAPSULE | ORAL | Status: AC
  Administered 2023-12-21: 25 mg via ORAL
  Filled 2023-12-21: qty 1

## 2023-12-21 MED ORDER — DIAZEPAM 5 MG PO TABS
10.0000 mg | ORAL_TABLET | Freq: Once | ORAL | Status: AC
  Administered 2023-12-21: 10 mg via ORAL
  Filled 2023-12-21: qty 2

## 2023-12-21 NOTE — Interval H&P Note (Signed)
 History and Physical Interval Note:  12/21/2023 8:27 AM  Teresa [name]  has presented today for surgery, with the diagnosis of right ureteral stone.  The various methods of treatment have been discussed with the patient and family. After consideration of risks, benefits and other options for treatment, the patient has consented to  Procedure(s): LITHOTRIPSY, ESWL (Right) as a surgical intervention.  The patient's history has been reviewed, patient examined, no change in status, stable for surgery.  I have reviewed the patient's chart and labs.  Questions were answered to the patient's satisfaction.     [name]

## 2023-12-21 NOTE — Progress Notes (Signed)
 Pt right side assessed, redness noted with petechia. No complaints voiced.

## 2023-12-22 ENCOUNTER — Telehealth: Payer: Self-pay

## 2023-12-22 ENCOUNTER — Encounter (HOSPITAL_COMMUNITY): Payer: Self-pay | Admitting: Urology

## 2023-12-22 NOTE — Telephone Encounter (Signed)
 Medication prior authorization request received.  Completed PA request through cover my meds for drug Ondansetron . KEY: [id number]  Approved: Pending

## 2023-12-24 ENCOUNTER — Encounter (HOSPITAL_COMMUNITY): Payer: Self-pay | Admitting: Urology

## 2023-12-28 ENCOUNTER — Other Ambulatory Visit: Payer: Self-pay

## 2023-12-28 ENCOUNTER — Telehealth: Payer: Self-pay

## 2023-12-28 DIAGNOSIS — N200 Calculus of kidney: Secondary | ICD-10-CM

## 2023-12-28 MED ORDER — PROMETHAZINE HCL 25 MG PO TABS: 25.0000 mg | ORAL_TABLET | Freq: Three times a day (TID) | ORAL | 0 refills | Status: DC | PRN

## 2023-12-28 NOTE — Telephone Encounter (Signed)
 Called pt to let her know Rx for nausea associated w/ kidney stones was called into pharmacy pt states has already and Rx for nausea and does not need another at this time pt stated she is still having lower back pain and wanted to know if it possible to still have a larger stone that she needs to pass pt also stated she has passed some white sediment but still having some pain pt was advised a message would be sent to provider but here is a chance there is a larger stone that needs to pass

## 2023-12-28 NOTE — Telephone Encounter (Signed)
 Patient not passing any kidney stones from lithotripsy procedure.  She is passing some white powdery substance.  Asking if this is normal.  Please advise.  Call:  [phone]

## 2023-12-28 NOTE — Telephone Encounter (Signed)
-----   Message from [name] sent at 12/28/2023  8:37 AM EDT ----- Phenertgan 12.5mg  tabs #30 ----- Message ----- From: Sammie Exie HERO, CMA Sent: 12/23/2023   9:34 AM EDT To: Belvie LITTIE Clara, MD  Rx of ondansetron  denied, do you recommend another rx

## 2024-01-04 NOTE — Telephone Encounter (Signed)
 Called pt and gave her MD McKenzie recommendation pt was also notified KUB order was in and to go to get her imaging  completed  prior to appt pt voiced her understanding

## 2024-01-10 ENCOUNTER — Other Ambulatory Visit: Payer: Self-pay

## 2024-01-10 DIAGNOSIS — N200 Calculus of kidney: Secondary | ICD-10-CM

## 2024-01-11 ENCOUNTER — Ambulatory Visit (HOSPITAL_COMMUNITY)
Admission: RE | Admit: 2024-01-11 | Discharge: 2024-01-11 | Disposition: A | Discharge status: Home / Self Care | Source: Ambulatory Visit | Attending: Urology | Admitting: Urology

## 2024-01-11 DIAGNOSIS — N200 Calculus of kidney: Secondary | ICD-10-CM | POA: Diagnosis not present

## 2024-01-11 DIAGNOSIS — I878 Other specified disorders of veins: Secondary | ICD-10-CM | POA: Diagnosis not present

## 2024-01-12 ENCOUNTER — Ambulatory Visit (INDEPENDENT_AMBULATORY_CARE_PROVIDER_SITE_OTHER): Admitting: Urology

## 2024-01-12 VITALS — BP 117/70 | HR 80

## 2024-01-12 DIAGNOSIS — N200 Calculus of kidney: Secondary | ICD-10-CM

## 2024-01-12 LAB — MICROSCOPIC EXAMINATION: Epithelial Cells (non renal): >10 /HPF — AB (ref 0–10)

## 2024-01-12 LAB — URINALYSIS, ROUTINE W REFLEX MICROSCOPIC
Bilirubin, UA: NEGATIVE
Glucose, UA: NEGATIVE
Nitrite, UA: NEGATIVE
Specific Gravity, UA: 1.025 (ref 1.005–1.030)
Urobilinogen, Ur: 1 mg/dL (ref 0.2–1.0)
pH, UA: 6 (ref 5.0–7.5)

## 2024-01-12 MED ORDER — OXYCODONE-ACETAMINOPHEN 5-325 MG PO TABS: 1.0000 | ORAL_TABLET | ORAL | 0 refills | Status: DC | PRN

## 2024-01-12 MED ORDER — ONDANSETRON 4 MG PO TBDP: 4.0000 mg | ORAL_TABLET | Freq: Three times a day (TID) | ORAL | 0 refills | Status: DC | PRN

## 2024-01-12 MED ORDER — TAMSULOSIN HCL 0.4 MG PO CAPS: 0.4000 mg | ORAL_CAPSULE | Freq: Every day | ORAL | 0 refills | Status: DC

## 2024-01-12 NOTE — Progress Notes (Signed)
 01/12/2024 9:41 AM   Teresa [name] 05-Feb-1951 [id number]  Referring provider: Shona Norleen PEDLAR, MD [address] [name],  [place] [address]  Followup nephrolithiasis   HPI: Ms [name] is a 73yo here for followup for nephrolithiasis. She underwent ESWL 4 weeks ago. She has seen some fragments. She has mild intermittent flank pain.   PMH: Past Medical History:  Diagnosis Date   Arthritis    Depression    Disc disorder    Fibromyalgia    GERD (gastroesophageal reflux disease)    High cholesterol    Hypertension    Insomnia     Surgical History: Past Surgical History:  Procedure Laterality Date   BUNIONECTOMY Bilateral    COLONOSCOPY WITH PROPOFOL  N/A 12/31/2017   Procedure: COLONOSCOPY WITH PROPOFOL ;  Surgeon: Golda Claudis PENNER, MD;  [facility];  Service: Endoscopy;  Laterality: N/A;  7:30   EXTRACORPOREAL SHOCK WAVE LITHOTRIPSY Right 12/21/2023   Procedure: LITHOTRIPSY, ESWL;  Surgeon: Sherrilee Belvie CROME, MD;  Location: [facility];  Service: Urology;  Laterality: Right;   FACIAL COSMETIC SURGERY     POLYPECTOMY  12/31/2017   Procedure: POLYPECTOMY;  Surgeon: Golda Claudis PENNER, MD;  [facility];  Service: Endoscopy;;  Transverse colon (CBx1)   TONSILLECTOMY      Home Medications:  Allergies as of 01/12/2024       Reactions   Chicken Allergy Anaphylaxis   Any meat at all  BITTEN BY TICK-DEVELOPED MAMMAL MEAT ALLERGY   Meat Extract Anaphylaxis   Any meat at all BITTEN BY TICK-DEVELOPED MAMMAL MEAT ALLERGY    Milk (cow) Other (See Comments)   All Diary Products due to the [employer] Allergy - Bitten by Tick   Milk-related Compounds Other (See Comments)   All Diary Products due to the [employer] Allergy - Bitten by Tick   Almond Oil Other (See Comments)   Lactose Other (See Comments)   Gi upset   Lactose Intolerance (gi) Other (See Comments)   Gi upset   Mixed Feathers Other (See Comments)   Latex Rash   Lisinopril Rash         Medication List        Accurate as of January 12, 2024  9:41 AM. If you have any questions, ask your nurse or doctor.          amLODipine 5 MG tablet Commonly known as: NORVASC Take 5 mg by mouth daily.   buPROPion 150 MG 12 hr tablet Commonly known as: WELLBUTRIN SR Take 1 tablet by mouth 2 (two) times daily.   DULoxetine 60 MG capsule Commonly known as: CYMBALTA Take 60 mg by mouth daily.   hydrALAZINE 25 MG tablet Commonly known as: APRESOLINE Take by mouth.   multivitamins ther. w/minerals Tabs tablet Take 1 tablet by mouth every morning. One-A-Day for Women 50+   ondansetron  4 MG disintegrating tablet Commonly known as: ZOFRAN -ODT Take 1 tablet (4 mg total) by mouth every 8 (eight) hours as needed.   oxyCODONE -acetaminophen  5-325 MG tablet Commonly known as: Percocet Take 1 tablet by mouth every 4 (four) hours as needed for severe pain (pain score 7-10).   pantoprazole  40 MG tablet Commonly known as: PROTONIX  Take 1 tablet (40 mg total) by mouth 2 (two) times daily before a meal. What changed: when to take this   pravastatin 80 MG tablet Commonly known as: PRAVACHOL Take 80 mg by mouth at bedtime.   Simethicone  180 MG Caps Commonly known  as: Phazyme Take 1 capsule (180 mg total) by mouth 2 (two) times daily. What changed:  when to take this reasons to take this   tamsulosin  0.4 MG Caps capsule Commonly known as: Flomax  Take 1 capsule (0.4 mg total) by mouth daily after supper.   temazepam 22.5 MG capsule Commonly known as: RESTORIL Take 22.5 mg by mouth at bedtime.   VITAMIN D PO Take 2,000 Units by mouth daily.        Allergies:  Allergies  Allergen Reactions   Chicken Allergy Anaphylaxis    Any meat at all  BITTEN BY TICK-DEVELOPED MAMMAL MEAT ALLERGY   Meat Extract Anaphylaxis    Any meat at all BITTEN BY TICK-DEVELOPED MAMMAL MEAT ALLERGY    Milk (Cow) Other (See Comments)    All Diary Products due to the [employer] Allergy -  Bitten by Tick   Milk-Related Compounds Other (See Comments)    All Diary Products due to the [employer] Allergy - Bitten by Tick   Almond Oil Other (See Comments)   Lactose Other (See Comments)    Gi upset   Lactose Intolerance (Gi) Other (See Comments)    Gi upset   Mixed Feathers Other (See Comments)   Latex Rash   Lisinopril Rash    Family History: Family History  Problem Relation Age of Onset   Diabetes Mother    Stroke Mother    Kidney disease Mother    Depression Mother    Heart disease Mother    Hypertension Mother    Clotting disorder Mother    Arthritis Mother    Osteoporosis Mother    Ulcers Father    GER disease Father    Alcohol abuse Father    Diabetes Sister    Hypertension Sister    Arthritis Sister    Asthma Maternal Grandmother    Colon cancer Neg Hx     Social History:  reports that she has never smoked. She has never used smokeless tobacco. She reports that she does not drink alcohol and does not use drugs.  ROS: All other review of systems were reviewed and are negative except what is noted above in HPI  Physical Exam: BP 117/70   Pulse 80   Constitutional:  Alert and oriented, No acute distress. HEENT: [place] AT, moist mucus membranes.  Trachea midline, no masses. Cardiovascular: No clubbing, cyanosis, or edema. Respiratory: Normal respiratory effort, no increased work of breathing. GI: Abdomen is soft, nontender, nondistended, no abdominal masses GU: No CVA tenderness.  Lymph: No cervical or inguinal lymphadenopathy. Skin: No rashes, bruises or suspicious lesions. Neurologic: Grossly intact, no focal deficits, moving all 4 extremities. Psychiatric: Normal mood and affect.  Laboratory Data: Lab Results  Component Value Date   WBC 5.2 01/26/2019   HGB 13.0 12/24/2017   HCT 38 01/26/2019   MCV 91 01/26/2019   PLT 213 12/24/2017    Lab Results  Component Value Date   CREATININE 0.88 12/24/2017    No results found for: PSA  No  results found for: TESTOSTERONE  Lab Results  Component Value Date   HGBA1C 5.7 03/30/2013    Urinalysis    Component Value Date/Time   APPEARANCEUR Clear 12/06/2023 1000   GLUCOSEU Negative 12/06/2023 1000   BILIRUBINUR Negative 12/06/2023 1000   KETONESUR trace (5) (A) 10/17/2023 0826   PROTEINUR Negative 12/06/2023 1000   UROBILINOGEN 1.0 10/17/2023 0826   NITRITE Negative 12/06/2023 1000   LEUKOCYTESUR Trace (A) 12/06/2023 1000  Lab Results  Component Value Date   LABMICR See below: 12/06/2023   WBCUA 6-10 (A) 12/06/2023   LABEPIT 0-10 12/06/2023   BACTERIA None seen 12/06/2023    Pertinent Imaging: KUb yesterday: Images reviewed and discussed with the patient Results for orders placed during the hospital encounter of 12/21/23  DG Abd 1 View  Narrative CLINICAL DATA:  Pre lithotripsy evaluation.  EXAM: ABDOMEN - 1 VIEW  COMPARISON:  December 06, 2023  FINDINGS: The bowel gas pattern is normal. A large amount of stool is seen throughout the colon. A stable 8 mm soft tissue calcification is seen overlying the medial aspect of the mid right abdomen. This is stable in position when compared to the prior study and likely located within the proximal right ureter.  IMPRESSION: Stable 8 mm renal calculus overlying the medial aspect of the mid right abdomen.   Electronically Signed By: Suzen Dials M.D. On: 12/21/2023 10:17  No results found for this or any previous visit.  No results found for this or any previous visit.  No results found for this or any previous visit.  Results for orders placed during the hospital encounter of 10/26/05  US  Renal  Narrative History: Renal artery stenosis  RENAL ULTRASOUND:  Kidneys measure 11.2 cm length right and 10.9 cm length left. Normal renal cortical thickness and echogenicity bilaterally. No hydronephrosis, renal mass, or shadowing calcification. No perinephric fluid collections. Bladder  decompressed, containing only minimal urine.  IMPRESSION: Normal renal ultrasound.  Provider: Alan Greenhouse  No results found for this or any previous visit.  No results found for this or any previous visit.  No results found for this or any previous visit.   Assessment & Plan:    1. Kidney stones (Primary) Followup 2 weeks with a KUB - Urinalysis, Routine w reflex microscopic   No follow-ups on file.  [name], MD  [facility] [place]

## 2024-01-24 ENCOUNTER — Other Ambulatory Visit (HOSPITAL_COMMUNITY): Payer: Self-pay | Admitting: Neurosurgery

## 2024-01-24 DIAGNOSIS — S22080A Wedge compression fracture of T11-T12 vertebra, initial encounter for closed fracture: Secondary | ICD-10-CM | POA: Diagnosis not present

## 2024-01-26 ENCOUNTER — Other Ambulatory Visit (HOSPITAL_COMMUNITY): Payer: Self-pay | Admitting: Nephrology

## 2024-01-26 DIAGNOSIS — N279 Small kidney, unspecified: Secondary | ICD-10-CM

## 2024-01-26 DIAGNOSIS — E559 Vitamin D deficiency, unspecified: Secondary | ICD-10-CM | POA: Diagnosis not present

## 2024-01-26 DIAGNOSIS — N1831 Chronic kidney disease, stage 3a: Secondary | ICD-10-CM | POA: Diagnosis not present

## 2024-01-26 DIAGNOSIS — R7303 Prediabetes: Secondary | ICD-10-CM | POA: Diagnosis not present

## 2024-01-26 DIAGNOSIS — N139 Obstructive and reflux uropathy, unspecified: Secondary | ICD-10-CM | POA: Diagnosis not present

## 2024-02-01 ENCOUNTER — Ambulatory Visit (HOSPITAL_COMMUNITY)
Admission: RE | Admit: 2024-02-01 | Discharge: 2024-02-01 | Disposition: A | Discharge status: Home / Self Care | Source: Ambulatory Visit | Attending: Neurosurgery | Admitting: Neurosurgery

## 2024-02-01 DIAGNOSIS — S22088A Other fracture of T11-T12 vertebra, initial encounter for closed fracture: Secondary | ICD-10-CM | POA: Diagnosis not present

## 2024-02-01 DIAGNOSIS — M47816 Spondylosis without myelopathy or radiculopathy, lumbar region: Secondary | ICD-10-CM | POA: Diagnosis not present

## 2024-02-01 DIAGNOSIS — S22080A Wedge compression fracture of T11-T12 vertebra, initial encounter for closed fracture: Secondary | ICD-10-CM | POA: Diagnosis not present

## 2024-02-04 ENCOUNTER — Ambulatory Visit (HOSPITAL_COMMUNITY)
Admission: RE | Admit: 2024-02-04 | Discharge: 2024-02-04 | Disposition: A | Discharge status: Home / Self Care | Source: Ambulatory Visit | Attending: Nephrology | Admitting: Nephrology

## 2024-02-04 DIAGNOSIS — N1831 Chronic kidney disease, stage 3a: Secondary | ICD-10-CM | POA: Diagnosis not present

## 2024-02-04 DIAGNOSIS — N1330 Unspecified hydronephrosis: Secondary | ICD-10-CM | POA: Diagnosis not present

## 2024-02-04 DIAGNOSIS — N279 Small kidney, unspecified: Secondary | ICD-10-CM | POA: Insufficient documentation

## 2024-02-04 DIAGNOSIS — N261 Atrophy of kidney (terminal): Secondary | ICD-10-CM | POA: Diagnosis not present

## 2024-02-08 DIAGNOSIS — N1831 Chronic kidney disease, stage 3a: Secondary | ICD-10-CM | POA: Diagnosis not present

## 2024-02-09 DIAGNOSIS — S22080G Wedge compression fracture of T11-T12 vertebra, subsequent encounter for fracture with delayed healing: Secondary | ICD-10-CM | POA: Diagnosis not present

## 2024-02-10 ENCOUNTER — Ambulatory Visit (HOSPITAL_COMMUNITY)
Admission: RE | Admit: 2024-02-10 | Discharge: 2024-02-10 | Disposition: A | Discharge status: Home / Self Care | Source: Ambulatory Visit | Attending: Urology | Admitting: Urology

## 2024-02-10 DIAGNOSIS — N200 Calculus of kidney: Secondary | ICD-10-CM | POA: Diagnosis not present

## 2024-02-11 ENCOUNTER — Encounter: Payer: Self-pay | Admitting: Urology

## 2024-02-11 ENCOUNTER — Ambulatory Visit: Admitting: Urology

## 2024-02-11 VITALS — BP 147/70 | HR 98

## 2024-02-11 DIAGNOSIS — N200 Calculus of kidney: Secondary | ICD-10-CM | POA: Diagnosis not present

## 2024-02-11 LAB — MICROSCOPIC EXAMINATION: Epithelial Cells (non renal): >10 /HPF — AB (ref 0–10)

## 2024-02-11 LAB — URINALYSIS, ROUTINE W REFLEX MICROSCOPIC
Bilirubin, UA: NEGATIVE
Glucose, UA: NEGATIVE
Ketones, UA: NEGATIVE
Nitrite, UA: NEGATIVE
Specific Gravity, UA: 1.01 (ref 1.005–1.030)
Urobilinogen, Ur: 1 mg/dL (ref 0.2–1.0)
pH, UA: 7 (ref 5.0–7.5)

## 2024-02-11 MED ORDER — ALFUZOSIN HCL ER 10 MG PO TB24: 10.0000 mg | ORAL_TABLET | Freq: Every day | ORAL | 0 refills | Status: DC

## 2024-02-11 NOTE — Progress Notes (Signed)
 02/11/2024 9:12 AM   Teresa [name] [birth year] [id number]  Referring provider: Shona Norleen PEDLAR, MD [address] [name],  [place] [address]  Followup nephrolithiasis   HPI: Teresa [name] is a 73yo here for followup for nephrolithiasis. Renal US  10/24 shows increased right hydronephrosis. She cannot take flomax  due to indigestion. She has intermittent right flank pain.    PMH: Past Medical History:  Diagnosis Date   Arthritis    Depression    Disc disorder    Fibromyalgia    GERD (gastroesophageal reflux disease)    High cholesterol    Hypertension    Insomnia     Surgical History: Past Surgical History:  Procedure Laterality Date   BUNIONECTOMY Bilateral    COLONOSCOPY WITH PROPOFOL  N/A 12/31/2017   Procedure: COLONOSCOPY WITH PROPOFOL ;  Surgeon: Golda Claudis PENNER, MD;  Location: [facility];  Service: Endoscopy;  Laterality: N/A;  7:30   EXTRACORPOREAL SHOCK WAVE LITHOTRIPSY Right 12/21/2023   Procedure: LITHOTRIPSY, ESWL;  Surgeon: Sherrilee Belvie CROME, MD;  Location: [facility];  Service: Urology;  Laterality: Right;   FACIAL COSMETIC SURGERY     POLYPECTOMY  12/31/2017   Procedure: POLYPECTOMY;  Surgeon: Golda Claudis PENNER, MD;  Location: [facility];  Service: Endoscopy;;  Transverse colon (CBx1)   TONSILLECTOMY      Home Medications:  Allergies as of 02/11/2024       Reactions   Chicken Allergy Anaphylaxis   Any meat at all  BITTEN BY TICK-DEVELOPED MAMMAL MEAT ALLERGY   Meat Extract Anaphylaxis   Any meat at all BITTEN BY TICK-DEVELOPED MAMMAL MEAT ALLERGY    Milk (cow) Other (See Comments)   All Diary Products due to the [employer] Allergy - Bitten by Tick   Milk-related Compounds Other (See Comments)   All Diary Products due to the [employer] Allergy - Bitten by Tick   Almond Oil Other (See Comments)   Lactose Other (See Comments)   Gi upset   Lactose Intolerance (gi) Other (See Comments)   Gi upset   Mixed Feathers Other (See Comments)   Latex Rash    Lisinopril Rash        Medication List        Accurate as of February 11, 2024  9:12 AM. If you have any questions, ask your nurse or doctor.          amLODipine 5 MG tablet Commonly known as: NORVASC Take 5 mg by mouth daily.   buPROPion 150 MG 12 hr tablet Commonly known as: WELLBUTRIN SR Take 1 tablet by mouth 2 (two) times daily.   DULoxetine 60 MG capsule Commonly known as: CYMBALTA Take 60 mg by mouth daily.   hydrALAZINE 25 MG tablet Commonly known as: APRESOLINE Take by mouth.   multivitamins ther. w/minerals Tabs tablet Take 1 tablet by mouth every morning. One-A-Day for Women 50+   ondansetron  4 MG disintegrating tablet Commonly known as: ZOFRAN -ODT Take 1 tablet (4 mg total) by mouth every 8 (eight) hours as needed.   oxyCODONE -acetaminophen  5-325 MG tablet Commonly known as: Percocet Take 1 tablet by mouth every 4 (four) hours as needed for severe pain (pain score 7-10).   pantoprazole  40 MG tablet Commonly known as: PROTONIX  Take 1 tablet (40 mg total) by mouth 2 (two) times daily before a meal. What changed: when to take this   pravastatin 80 MG tablet Commonly known as: PRAVACHOL Take 80 mg by mouth at bedtime.   Simethicone  180  MG Caps Commonly known as: Phazyme Take 1 capsule (180 mg total) by mouth 2 (two) times daily. What changed:  when to take this reasons to take this   tamsulosin  0.4 MG Caps capsule Commonly known as: Flomax  Take 1 capsule (0.4 mg total) by mouth daily after supper.   temazepam 22.5 MG capsule Commonly known as: RESTORIL Take 22.5 mg by mouth at bedtime.   VITAMIN D PO Take 2,000 Units by mouth daily.        Allergies:  Allergies  Allergen Reactions   Chicken Allergy Anaphylaxis    Any meat at all  BITTEN BY TICK-DEVELOPED MAMMAL MEAT ALLERGY   Meat Extract Anaphylaxis    Any meat at all BITTEN BY TICK-DEVELOPED MAMMAL MEAT ALLERGY    Milk (Cow) Other (See Comments)    All Diary Products due to  the [employer] Allergy - Bitten by Tick   Milk-Related Compounds Other (See Comments)    All Diary Products due to the [employer] Allergy - Bitten by Tick   Almond Oil Other (See Comments)   Lactose Other (See Comments)    Gi upset   Lactose Intolerance (Gi) Other (See Comments)    Gi upset   Mixed Feathers Other (See Comments)   Latex Rash   Lisinopril Rash    Family History: Family History  Problem Relation Age of Onset   Diabetes Mother    Stroke Mother    Kidney disease Mother    Depression Mother    Heart disease Mother    Hypertension Mother    Clotting disorder Mother    Arthritis Mother    Osteoporosis Mother    Ulcers Father    GER disease Father    Alcohol abuse Father    Diabetes Sister    Hypertension Sister    Arthritis Sister    Asthma Maternal Grandmother    Colon cancer Neg Hx     Social History:  reports that she has never smoked. She has never used smokeless tobacco. She reports that she does not drink alcohol and does not use drugs.  ROS: All other review of systems were reviewed and are negative except what is noted above in HPI  Physical Exam: BP (!) 147/70   Pulse 98   Constitutional:  Alert and oriented, No acute distress. HEENT: [place] AT, moist mucus membranes.  Trachea midline, no masses. Cardiovascular: No clubbing, cyanosis, or edema. Respiratory: Normal respiratory effort, no increased work of breathing. GI: Abdomen is soft, nontender, nondistended, no abdominal masses GU: No CVA tenderness.  Lymph: No cervical or inguinal lymphadenopathy. Skin: No rashes, bruises or suspicious lesions. Neurologic: Grossly intact, no focal deficits, moving all 4 extremities. Psychiatric: Normal mood and affect.  Laboratory Data: Lab Results  Component Value Date   WBC 5.2 01/26/2019   HGB 13.0 12/24/2017   HCT 38 01/26/2019   MCV 91 01/26/2019   PLT 213 12/24/2017    Lab Results  Component Value Date   CREATININE 0.88 12/24/2017    No  results found for: PSA  No results found for: TESTOSTERONE  Lab Results  Component Value Date   HGBA1C 5.7 03/30/2013    Urinalysis    Component Value Date/Time   APPEARANCEUR Clear 01/12/2024 0917   GLUCOSEU Negative 01/12/2024 0917   BILIRUBINUR Negative 01/12/2024 0917   KETONESUR trace (5) (A) 10/17/2023 0826   PROTEINUR Trace 01/12/2024 0917   UROBILINOGEN 1.0 10/17/2023 0826   NITRITE Negative 01/12/2024 0917   LEUKOCYTESUR 1+ (  A) 01/12/2024 0917    Lab Results  Component Value Date   LABMICR See below: 01/12/2024   WBCUA 6-10 (A) 01/12/2024   LABEPIT >10 (A) 01/12/2024   BACTERIA Many (A) 01/12/2024    Pertinent Imaging: KUb today: Images reviewed and discussed with the patient  Results for orders placed in visit on 01/10/24  DG Abd 1 View  Narrative CLINICAL DATA:  Status post lithotripsy.  EXAM: ABDOMEN - 1 VIEW  COMPARISON:  Most recent radiograph 12/21/2023  FINDINGS: Vague calcifications to the right of L3 at site of previous stone may represent residual stone fragments. No evidence of intrarenal calculi. Stable left pelvic phlebolith. Moderate colonic stool burden without obstruction.  IMPRESSION: Vague calcifications to the right of L3 at site of previous stone may represent residual stone fragments.   Electronically Signed By: Andrea Gasman M.D. On: 01/14/2024 21:30  No results found for this or any previous visit.  No results found for this or any previous visit.  No results found for this or any previous visit.  Results for orders placed during the hospital encounter of 02/04/24  US  RENAL  Narrative EXAM: US  Retroperitoneum Complete, Renal.  CLINICAL HISTORY: CKD 3a.  TECHNIQUE: Real-time ultrasound of the retroperitoneum (complete) with image documentation.  COMPARISON: Comparison date 88 and 25.  FINDINGS:  RIGHT KIDNEY: Right kidney measures 9.7 x 5.2 x 4.6 cm. Volume is 120 ml. There is  increased echogenicity throughout the right kidney. There is moderate right hydronephrosis similar to prior CT. No renal stone or mass visualized.  LEFT KIDNEY: The left kidney measures 11.6 x 5.8 x 5.7 cm, volume 201 ml. No hydronephrosis, renal stone, or mass visualized.  BLADDER: No significant postvoid residual. Unremarkable as visualized.  IMPRESSION: 1. Moderate right hydronephrosis, similar to prior CT. 2. Atrophy and Increased echogenicity throughout the right kidney compatible with medical renal disease .  Electronically signed by: Greig Pique MD 02/08/2024 01:10 AM EDT RP Workstation: [id number]  No results found for this or any previous visit.  No results found for this or any previous visit.  No results found for this or any previous visit.   Assessment & Plan:    1. Kidney stones (Primary) -We discussed the management of kidney stones. These options include observation, ureteroscopy, shockwave lithotripsy (ESWL) and percutaneous nephrolithotomy (PCNL). We discussed which options are relevant to the patient's stone(s). We discussed the natural history of kidney stones as well as the complications of untreated stones and the impact on quality of life without treatment as well as with each of the above listed treatments. We also discussed the efficacy of each treatment in its ability to clear the stone burden. With any of these management options I discussed the signs and symptoms of infection and the need for emergent treatment should these be experienced. For each option we discussed the ability of each procedure to clear the patient of their stone burden.   For observation I described the risks which include but are not limited to silent renal damage, life-threatening infection, need for emergent surgery, failure to pass stone and pain.   For ureteroscopy I described the risks which include bleeding, infection, damage to contiguous structures, positioning injury,  ureteral stricture, ureteral avulsion, ureteral injury, need for prolonged ureteral stent, inability to perform ureteroscopy, need for an interval procedure, inability to clear stone burden, stent discomfort/pain, heart attack, stroke, pulmonary embolus and the inherent risks with general anesthesia.   For shockwave lithotripsy I described the risks which include  arrhythmia, kidney contusion, kidney hemorrhage, need for transfusion, pain, inability to adequately break up stone, inability to pass stone fragments, Steinstrasse, infection associated with obstructing stones, need for alternate surgical procedure, need for repeat shockwave lithotripsy, MI, CVA, PE and the inherent risks with anesthesia/conscious sedation.   For PCNL I described the risks including positioning injury, pneumothorax, hydrothorax, need for chest tube, inability to clear stone burden, renal laceration, arterial venous fistula or malformation, need for embolization of kidney, loss of kidney or renal function, need for repeat procedure, need for prolonged nephrostomy tube, ureteral avulsion, MI, CVA, PE and the inherent risks of general anesthesia.   - The patient would like to proceed with right ureteroscopic stone extraction - Urinalysis, Routine w reflex microscopic   No follow-ups on file.  [name], MD  [facility]

## 2024-02-11 NOTE — Patient Instructions (Signed)
Ureteroscopy  Ureteroscopy is a procedure to check for and treat problems inside part of the urinary tract. In this procedure, a long rigid or flexible tube with a lens and light at the end (ureteroscope) is used to look at the inside of the kidneys and the ureters. The ureters are the tubes that carry urine from the kidneys to the bladder. The ureteroscope is inserted into one or both of the ureters. You may need this procedure if you have frequent urinary tract infections (UTIs), blood in your urine, or a stone in one or both of your ureters. A ureteroscopy can be done: To find the cause of urine blockage in a ureter and to evaluate other abnormalities inside the ureters or kidneys. To remove stones. To remove or treat growths of tissue (polyps), abnormal tissue, and some types of tumors. To remove a tissue sample and check it for disease under a microscope (biopsy). Tell a health care provider about: Any allergies you have. All medicines you are taking, including vitamins, herbs, eye drops, creams, and over-the-counter medicines. Any problems you or family members have had with anesthetic medicines. Any bleeding problems you have. Any surgeries you have had. Any medical conditions you have. Whether you are pregnant or may be pregnant. What are the risks? Your health care provider will talk with you about risks. These may include: Abdominal pain or a burning feeling or pain while urinating. Abnormal bleeding. A UTI. Allergic reactions to medicines. Scarring that narrows the ureter (stricture) or swelling. Creating a hole (perforation) in the ureter. Damage to other structures or organs, such as the part of your body that drains urine from your bladder (urethra), your bladder, or your uterus. What happens before the procedure? When to stop eating and drinking  8 hours before your procedure Stop eating most foods. Do not eat meat, fried foods, or fatty foods. Eat only light foods, such  as toast or crackers. All liquids are okay except energy drinks and alcohol. 6 hours before your procedure Stop eating. Drink only clear liquids, such as water, clear fruit juice, black coffee, plain tea, and sports drinks. Do not drink energy drinks or alcohol. 2 hours before your procedure Stop drinking all liquids. You may be allowed to take medicines with small sips of water. Medicines Ask your health care provider about: Changing or stopping your regular medicines. These include any diabetes medicines or blood thinners you take. Taking medicines such as aspirin and ibuprofen. These medicines can thin your blood. Do not take these medicines unless your health care provider tells you to. Taking over-the-counter medicines, vitamins, herbs, and supplements. General instructions Do not use any products that contain nicotine or tobacco for at least 4 weeks before the procedure. These products include cigarettes, chewing tobacco, and vaping devices, such as e-cigarettes. If you need help quitting, ask your health care provider. If you will be going home right after the procedure, plan to have a responsible adult: Take you home from the hospital or clinic. You will not be allowed to drive. Care for you for the time you are told. Ask your health care provider what steps will be taken to help prevent infection. These may include: Washing skin with a soap that kills germs. Receiving antibiotic medicine. Tests You may have an exam or testing. You may have a urine sample taken to check for infection. What happens during the procedure? An IV will be inserted into one of your veins. You may be given: A sedative. This helps   you relax. Anesthesia. This will: Numb certain areas of your body. Make you fall asleep for surgery. Your urethra will be cleaned with a germ-killing solution. The ureteroscope will be passed through your urethra into your bladder. A salt-water solution will be sent through  the ureteroscope to fill your bladder. This will help the health care provider see the openings of your ureters more clearly. The ureteroscope will be passed into your ureter. If a growth is found, a biopsy may be done. If a stone is found, it may be removed through the ureteroscope, or the stone may be broken up using a laser, shock waves, or electrical energy. In some cases, if the ureter is too small, a tube may be inserted that keeps the ureter open (ureteral stent). The stent may be left in place for 1 or 2 weeks, and then the ureteroscopy procedure will be done again. The scope will be removed, and your bladder will be emptied. The procedure may vary among health care providers and hospitals. What happens after the procedure? Your blood pressure, heart rate, breathing rate, and blood oxygen level will be monitored until you leave the hospital or clinic. It is up to you to get the results of your procedure. Ask your health care provider, or the department that is doing the procedure, when your results will be ready. Summary Ureteroscopy is a procedure used to look at the inside of the kidneys and the ureters. You may need this procedure if you have frequent urinary tract infections (UTIs), blood in your urine, or a stone in one or both of your ureters. Follow instructions from your health care provider about eating and drinking. In some cases, if the ureter is too small, a tube may be inserted that keeps the ureter open (ureteral stent). The stent may be left in place for 1 or 2 weeks to keep the ureter open, and then the ureteroscopy procedure will be done again. This information is not intended to replace advice given to you by your health care provider. Make sure you discuss any questions you have with your health care provider. Document Revised: 03/02/2022 Document Reviewed: 03/02/2022 Elsevier Patient Education  2024 [employer]  

## 2024-02-11 NOTE — H&P (View-Only) (Signed)
 02/11/2024 9:12 AM   Rock GORMAN Skeen [id number] [id number]  Referring provider: Shona Norleen PEDLAR, MD [address] [name],  [place] [address]  Followup nephrolithiasis   HPI: Ms Kaner is a 73yo here for followup for nephrolithiasis. Renal US  10/24 shows increased right hydronephrosis. She cannot take flomax  due to indigestion. She has intermittent right flank pain.    PMH: Past Medical History:  Diagnosis Date   Arthritis    Depression    Disc disorder    Fibromyalgia    GERD (gastroesophageal reflux disease)    High cholesterol    Hypertension    Insomnia     Surgical History: Past Surgical History:  Procedure Laterality Date   BUNIONECTOMY Bilateral    COLONOSCOPY WITH PROPOFOL  N/A 12/31/2017   Procedure: COLONOSCOPY WITH PROPOFOL ;  Surgeon: Golda Claudis PENNER, MD;  Location: [facility];  Service: Endoscopy;  Laterality: N/A;  7:30   EXTRACORPOREAL SHOCK WAVE LITHOTRIPSY Right 12/21/2023   Procedure: LITHOTRIPSY, ESWL;  Surgeon: Sherrilee Belvie CROME, MD;  Location: [facility];  Service: Urology;  Laterality: Right;   FACIAL COSMETIC SURGERY     POLYPECTOMY  12/31/2017   Procedure: POLYPECTOMY;  Surgeon: Golda Claudis PENNER, MD;  Location: [facility];  Service: Endoscopy;;  Transverse colon (CBx1)   TONSILLECTOMY      Home Medications:  Allergies as of 02/11/2024       Reactions   Chicken Allergy Anaphylaxis   Any meat at all  BITTEN BY TICK-DEVELOPED MAMMAL MEAT ALLERGY   Meat Extract Anaphylaxis   Any meat at all BITTEN BY TICK-DEVELOPED MAMMAL MEAT ALLERGY    Milk (cow) Other (See Comments)   All Diary Products due to the [employer] Allergy - Bitten by Tick   Milk-related Compounds Other (See Comments)   All Diary Products due to the [employer] Allergy - Bitten by Tick   Almond Oil Other (See Comments)   Lactose Other (See Comments)   Gi upset   Lactose Intolerance (gi) Other (See Comments)   Gi upset   Mixed Feathers Other (See Comments)   Latex Rash    Lisinopril Rash        Medication List        Accurate as of February 11, 2024  9:12 AM. If you have any questions, ask your nurse or doctor.          amLODipine 5 MG tablet Commonly known as: NORVASC Take 5 mg by mouth daily.   buPROPion 150 MG 12 hr tablet Commonly known as: WELLBUTRIN SR Take 1 tablet by mouth 2 (two) times daily.   DULoxetine 60 MG capsule Commonly known as: CYMBALTA Take 60 mg by mouth daily.   hydrALAZINE 25 MG tablet Commonly known as: APRESOLINE Take by mouth.   multivitamins ther. w/minerals Tabs tablet Take 1 tablet by mouth every morning. One-A-Day for Women 50+   ondansetron  4 MG disintegrating tablet Commonly known as: ZOFRAN -ODT Take 1 tablet (4 mg total) by mouth every 8 (eight) hours as needed.   oxyCODONE -acetaminophen  5-325 MG tablet Commonly known as: Percocet Take 1 tablet by mouth every 4 (four) hours as needed for severe pain (pain score 7-10).   pantoprazole  40 MG tablet Commonly known as: PROTONIX  Take 1 tablet (40 mg total) by mouth 2 (two) times daily before a meal. What changed: when to take this   pravastatin 80 MG tablet Commonly known as: PRAVACHOL Take 80 mg by mouth at bedtime.   Simethicone  180  MG Caps Commonly known as: Phazyme Take 1 capsule (180 mg total) by mouth 2 (two) times daily. What changed:  when to take this reasons to take this   tamsulosin  0.4 MG Caps capsule Commonly known as: Flomax  Take 1 capsule (0.4 mg total) by mouth daily after supper.   temazepam 22.5 MG capsule Commonly known as: RESTORIL Take 22.5 mg by mouth at bedtime.   VITAMIN D PO Take 2,000 Units by mouth daily.        Allergies:  Allergies  Allergen Reactions   Chicken Allergy Anaphylaxis    Any meat at all  BITTEN BY TICK-DEVELOPED MAMMAL MEAT ALLERGY   Meat Extract Anaphylaxis    Any meat at all BITTEN BY TICK-DEVELOPED MAMMAL MEAT ALLERGY    Milk (Cow) Other (See Comments)    All Diary Products due to  the [employer] Allergy - Bitten by Tick   Milk-Related Compounds Other (See Comments)    All Diary Products due to the [employer] Allergy - Bitten by Tick   Almond Oil Other (See Comments)   Lactose Other (See Comments)    Gi upset   Lactose Intolerance (Gi) Other (See Comments)    Gi upset   Mixed Feathers Other (See Comments)   Latex Rash   Lisinopril Rash    Family History: Family History  Problem Relation Age of Onset   Diabetes Mother    Stroke Mother    Kidney disease Mother    Depression Mother    Heart disease Mother    Hypertension Mother    Clotting disorder Mother    Arthritis Mother    Osteoporosis Mother    Ulcers Father    GER disease Father    Alcohol abuse Father    Diabetes Sister    Hypertension Sister    Arthritis Sister    Asthma Maternal Grandmother    Colon cancer Neg Hx     Social History:  reports that she has never smoked. She has never used smokeless tobacco. She reports that she does not drink alcohol and does not use drugs.  ROS: All other review of systems were reviewed and are negative except what is noted above in HPI  Physical Exam: BP (!) 147/70   Pulse 98   Constitutional:  Alert and oriented, No acute distress. HEENT: [place] AT, moist mucus membranes.  Trachea midline, no masses. Cardiovascular: No clubbing, cyanosis, or edema. Respiratory: Normal respiratory effort, no increased work of breathing. GI: Abdomen is soft, nontender, nondistended, no abdominal masses GU: No CVA tenderness.  Lymph: No cervical or inguinal lymphadenopathy. Skin: No rashes, bruises or suspicious lesions. Neurologic: Grossly intact, no focal deficits, moving all 4 extremities. Psychiatric: Normal mood and affect.  Laboratory Data: Lab Results  Component Value Date   WBC 5.2 01/26/2019   HGB 13.0 12/24/2017   HCT 38 01/26/2019   MCV 91 01/26/2019   PLT 213 12/24/2017    Lab Results  Component Value Date   CREATININE 0.88 12/24/2017    No  results found for: PSA  No results found for: TESTOSTERONE  Lab Results  Component Value Date   HGBA1C 5.7 03/30/2013    Urinalysis    Component Value Date/Time   APPEARANCEUR Clear 01/12/2024 0917   GLUCOSEU Negative 01/12/2024 0917   BILIRUBINUR Negative 01/12/2024 0917   KETONESUR trace (5) (A) 10/17/2023 0826   PROTEINUR Trace 01/12/2024 0917   UROBILINOGEN 1.0 10/17/2023 0826   NITRITE Negative 01/12/2024 0917   LEUKOCYTESUR 1+ (  A) 01/12/2024 0917    Lab Results  Component Value Date   LABMICR See below: 01/12/2024   WBCUA 6-10 (A) 01/12/2024   LABEPIT >10 (A) 01/12/2024   BACTERIA Many (A) 01/12/2024    Pertinent Imaging: KUb today: Images reviewed and discussed with the patient  Results for orders placed in visit on 01/10/24  DG Abd 1 View  Narrative CLINICAL DATA:  Status post lithotripsy.  EXAM: ABDOMEN - 1 VIEW  COMPARISON:  Most recent radiograph 12/21/2023  FINDINGS: Vague calcifications to the right of L3 at site of previous stone may represent residual stone fragments. No evidence of intrarenal calculi. Stable left pelvic phlebolith. Moderate colonic stool burden without obstruction.  IMPRESSION: Vague calcifications to the right of L3 at site of previous stone may represent residual stone fragments.   Electronically Signed By: Andrea Gasman M.D. On: 01/14/2024 21:30  No results found for this or any previous visit.  No results found for this or any previous visit.  No results found for this or any previous visit.  Results for orders placed during the hospital encounter of 02/04/24  US  RENAL  Narrative EXAM: US  Retroperitoneum Complete, Renal.  CLINICAL HISTORY: CKD 3a.  TECHNIQUE: Real-time ultrasound of the retroperitoneum (complete) with image documentation.  COMPARISON: Comparison date 88 and 25.  FINDINGS:  RIGHT KIDNEY: Right kidney measures 9.7 x 5.2 x 4.6 cm. Volume is 120 ml. There is  increased echogenicity throughout the right kidney. There is moderate right hydronephrosis similar to prior CT. No renal stone or mass visualized.  LEFT KIDNEY: The left kidney measures 11.6 x 5.8 x 5.7 cm, volume 201 ml. No hydronephrosis, renal stone, or mass visualized.  BLADDER: No significant postvoid residual. Unremarkable as visualized.  IMPRESSION: 1. Moderate right hydronephrosis, similar to prior CT. 2. Atrophy and Increased echogenicity throughout the right kidney compatible with medical renal disease .  Electronically signed by: Greig Pique MD 02/08/2024 01:10 AM EDT RP Workstation: [id number]  No results found for this or any previous visit.  No results found for this or any previous visit.  No results found for this or any previous visit.   Assessment & Plan:    1. Kidney stones (Primary) -We discussed the management of kidney stones. These options include observation, ureteroscopy, shockwave lithotripsy (ESWL) and percutaneous nephrolithotomy (PCNL). We discussed which options are relevant to the patient's stone(s). We discussed the natural history of kidney stones as well as the complications of untreated stones and the impact on quality of life without treatment as well as with each of the above listed treatments. We also discussed the efficacy of each treatment in its ability to clear the stone burden. With any of these management options I discussed the signs and symptoms of infection and the need for emergent treatment should these be experienced. For each option we discussed the ability of each procedure to clear the patient of their stone burden.   For observation I described the risks which include but are not limited to silent renal damage, life-threatening infection, need for emergent surgery, failure to pass stone and pain.   For ureteroscopy I described the risks which include bleeding, infection, damage to contiguous structures, positioning injury,  ureteral stricture, ureteral avulsion, ureteral injury, need for prolonged ureteral stent, inability to perform ureteroscopy, need for an interval procedure, inability to clear stone burden, stent discomfort/pain, heart attack, stroke, pulmonary embolus and the inherent risks with general anesthesia.   For shockwave lithotripsy I described the risks which include  arrhythmia, kidney contusion, kidney hemorrhage, need for transfusion, pain, inability to adequately break up stone, inability to pass stone fragments, Steinstrasse, infection associated with obstructing stones, need for alternate surgical procedure, need for repeat shockwave lithotripsy, MI, CVA, PE and the inherent risks with anesthesia/conscious sedation.   For PCNL I described the risks including positioning injury, pneumothorax, hydrothorax, need for chest tube, inability to clear stone burden, renal laceration, arterial venous fistula or malformation, need for embolization of kidney, loss of kidney or renal function, need for repeat procedure, need for prolonged nephrostomy tube, ureteral avulsion, MI, CVA, PE and the inherent risks of general anesthesia.   - The patient would like to proceed with right ureteroscopic stone extraction - Urinalysis, Routine w reflex microscopic   No follow-ups on file.  [name], MD  [facility]

## 2024-02-12 DIAGNOSIS — N1831 Chronic kidney disease, stage 3a: Secondary | ICD-10-CM | POA: Diagnosis not present

## 2024-02-12 DIAGNOSIS — N200 Calculus of kidney: Secondary | ICD-10-CM | POA: Diagnosis not present

## 2024-02-12 DIAGNOSIS — N139 Obstructive and reflux uropathy, unspecified: Secondary | ICD-10-CM | POA: Diagnosis not present

## 2024-02-12 DIAGNOSIS — N270 Small kidney, unilateral: Secondary | ICD-10-CM | POA: Diagnosis not present

## 2024-02-16 ENCOUNTER — Encounter (HOSPITAL_COMMUNITY)
Admission: RE | Admit: 2024-02-16 | Discharge: 2024-02-16 | Disposition: A | Discharge status: Home / Self Care | Source: Ambulatory Visit | Attending: Urology | Admitting: Urology

## 2024-02-16 ENCOUNTER — Encounter (HOSPITAL_COMMUNITY): Payer: Self-pay

## 2024-02-16 VITALS — BP 147/70 | HR 98 | Resp 18 | Ht 67.0 in | Wt 143.1 lb

## 2024-02-16 DIAGNOSIS — Z01818 Encounter for other preprocedural examination: Secondary | ICD-10-CM | POA: Diagnosis not present

## 2024-02-16 DIAGNOSIS — N1831 Chronic kidney disease, stage 3a: Secondary | ICD-10-CM | POA: Insufficient documentation

## 2024-02-16 DIAGNOSIS — R7303 Prediabetes: Secondary | ICD-10-CM | POA: Diagnosis not present

## 2024-02-16 DIAGNOSIS — I10 Essential (primary) hypertension: Secondary | ICD-10-CM

## 2024-02-16 DIAGNOSIS — I491 Atrial premature depolarization: Secondary | ICD-10-CM | POA: Diagnosis not present

## 2024-02-16 DIAGNOSIS — I129 Hypertensive chronic kidney disease with stage 1 through stage 4 chronic kidney disease, or unspecified chronic kidney disease: Secondary | ICD-10-CM | POA: Diagnosis not present

## 2024-02-16 HISTORY — DX: Nausea with vomiting, unspecified: R11.2

## 2024-02-16 HISTORY — DX: Other specified postprocedural states: Z98.890

## 2024-02-16 HISTORY — DX: Nontoxic goiter, unspecified: E04.9

## 2024-02-16 LAB — BASIC METABOLIC PANEL WITH GFR
Anion gap: 9 (ref 5–15)
BUN: 22 mg/dL (ref 8–23)
CO2: 26 mmol/L (ref 22–32)
Calcium: 9.2 mg/dL (ref 8.9–10.3)
Chloride: 105 mmol/L (ref 98–111)
Creatinine, Ser: 1.27 mg/dL — ABNORMAL HIGH (ref 0.44–1.00)
GFR, Estimated: 45 mL/min — ABNORMAL LOW (ref >60)
Glucose, Bld: 100 mg/dL — ABNORMAL HIGH (ref 70–99)
Potassium: 4.2 mmol/L (ref 3.5–5.1)
Sodium: 140 mmol/L (ref 135–145)

## 2024-02-16 LAB — HEMOGLOBIN A1C
Hgb A1c MFr Bld: 5.3 % (ref 4.8–5.6)
Mean Plasma Glucose: 105.41 mg/dL

## 2024-02-16 NOTE — Patient Instructions (Signed)
 Teresa [name]  02/16/2024     @PREFPERIOPPHARMACY @   Your procedure is scheduled on 02/17/2024.    Report to [facility] at 1150 A.M.   Call this number if you have problems the morning of surgery:  [phone]  If you experience any cold or flu symptoms such as cough, fever, chills, shortness of breath, etc. between now and your scheduled surgery, please notify us  at the above number.   Remember:  Do not eat after midnight.   You may drink clear liquids until  0950 am on 04/19/2023.     Clear liquids allowed are:                    Water , Carbonated beverages (diabetics please choose diet or no sugar options), Clear Tea (No creamer, milk, or cream, including half & half and powdered creamer), Black Coffee Only (No creamer, milk or cream, including half & half and powdered creamer), and Clear Sports drink (No red color; diabetics please choose diet or no sugar options)    Take these medicines the morning of surgery with A SIP OF WATER           amlodipine, duloxetine, pantoprazole , zofran  (if needed).    Do not wear jewelry, make-up or nail polish, including gel polish,  artificial nails, or any other type of covering on natural nails (fingers and  toes).  Do not wear lotions, powders, or perfumes, or deodorant.  Do not shave 48 hours prior to surgery.  Men may shave face and neck.  Do not bring valuables to the hospital.  [facility] is not responsible for any belongings or valuables.  Contacts, dentures or bridgework may not be worn into surgery.  Leave your suitcase in the car.  After surgery it may be brought to your room.  For patients admitted to the hospital, discharge time will be determined by your treatment team.  Patients discharged the day of surgery will not be allowed to drive home and must have someone with them for 24 hours.    Special instructions:  DO NOT smoke tobacco or vape for 24 hours before your procedure.  Please read over the following  fact sheets that you were given. Coughing and Deep Breathing, Surgical Site Infection Prevention, Anesthesia Post-op Instructions, and Care and Recovery After Surgery       Ureteral Stent Implantation, Care After The following information offers guidance on how to care for yourself after your procedure. Your health care provider may also give you more specific instructions. If you have problems or questions, contact your health care provider. What can I expect after the procedure? After the procedure, it is common to have: Nausea. Mild pain when you urinate. You may feel this pain in your lower back or lower abdomen. The pain should stop within a few minutes after you urinate. This pattern may last for up to 1 week. A small amount of blood in your urine for several days. Follow these instructions at home: Medicines Take over-the-counter and prescription medicines only as told by your health care provider. If you were prescribed antibiotics, take them as told by your health care provider. Do not stop using the antibiotic even if you start to feel better. If you were given a sedative during the procedure, it can affect you for several hours. Do not drive or operate machinery until your health care provider says that it is safe. Ask your health care provider if  the medicine prescribed to you: Requires you to avoid driving or using machinery. Can cause constipation. You may need to take these actions to prevent or treat constipation: Take over-the-counter or prescription medicines. Eat foods that are high in fiber, such as beans, whole grains, and fresh fruits and vegetables. Limit foods that are high in fat and processed sugars, such as fried or sweet foods. Activity Rest as told by your health care provider. Do not sit for a long time without moving. Get up to take short walks every 1-2 hours. This will improve blood flow and breathing. Ask for help if you feel weak or unsteady. Return to  your normal activities as told by your health care provider. Ask your health care provider what activities are safe for you. General instructions  If you have a catheter: Follow instructions from your health care provider about taking care of your catheter and collection bag. Do not take baths, swim, or use a hot tub until your health care provider approves. Ask your health care provider if you may take showers. You may only be allowed to take sponge baths. Drink enough fluid to keep your urine pale yellow. Do not use any products that contain nicotine or tobacco. These products include cigarettes, chewing tobacco, and vaping devices, such as e-cigarettes. These can delay healing after surgery. If you need help quitting, ask your health care provider. Keep all follow-up visits. Contact a health care provider if: You start passing blood clots, or you have more than a small amount of blood in your urine. You have pain that gets worse or does not get better with medicine, especially pain when you urinate. You have trouble urinating. You feel nauseous or you vomit again and again during a period of more than 2 days after the procedure. You have a fever. Get help right away if: You are passing blood clots that are 1 inch (2.5 cm) or larger in size. You are leaking urine (have incontinence), or you cannot urinate. The end of the stent comes out of your urethra. You have sudden, sharp, or severe pain in your abdomen or lower back. You have swelling or pain in your legs. You have trouble breathing. These symptoms may be an emergency. Get help right away. Call 911. Do not wait to see if the symptoms will go away. Do not drive yourself to the hospital. Summary After the procedure, it is common to have mild pain when you urinate that goes away within a few minutes after you urinate. This may last for up to 1 week. Take over-the-counter and prescription medicines only as told by your health care  provider. Drink enough fluid to keep your urine pale yellow. Call your health care provider if you start passing blood clots, or you have more than a small amount of blood in your urine. This information is not intended to replace advice given to you by your health care provider. Make sure you discuss any questions you have with your health care provider. Document Revised: 05/05/2021 Document Reviewed: 05/05/2021 Elsevier Patient Education  2024 [employer].General Anesthesia, Adult, Care After The following information offers guidance on how to care for yourself after your procedure. Your health care provider may also give you more specific instructions. If you have problems or questions, contact your health care provider. What can I expect after the procedure? After the procedure, it is common for people to: Have pain or discomfort at the IV site. Have nausea or vomiting. Have a sore  throat or hoarseness. Have trouble concentrating. Feel cold or chills. Feel weak, sleepy, or tired (fatigue). Have soreness and body aches. These can affect parts of the body that were not involved in surgery. Follow these instructions at home: For the time period you were told by your health care provider:  Rest. Do not participate in activities where you could fall or become injured. Do not drive or use machinery. Do not drink alcohol. Do not take sleeping pills or medicines that cause drowsiness. Do not make important decisions or sign legal documents. Do not take care of children on your own. General instructions Drink enough fluid to keep your urine pale yellow. If you have sleep apnea, surgery and certain medicines can increase your risk for breathing problems. Follow instructions from your health care provider about wearing your sleep device: Anytime you are sleeping, including during daytime naps. While taking prescription pain medicines, sleeping medicines, or medicines that make you  drowsy. Return to your normal activities as told by your health care provider. Ask your health care provider what activities are safe for you. Take over-the-counter and prescription medicines only as told by your health care provider. Do not use any products that contain nicotine or tobacco. These products include cigarettes, chewing tobacco, and vaping devices, such as e-cigarettes. These can delay incision healing after surgery. If you need help quitting, ask your health care provider. Contact a health care provider if: You have nausea or vomiting that does not get better with medicine. You vomit every time you eat or drink. You have pain that does not get better with medicine. You cannot urinate or have bloody urine. You develop a skin rash. You have a fever. Get help right away if: You have trouble breathing. You have chest pain. You vomit blood. These symptoms may be an emergency. Get help right away. Call 911. Do not wait to see if the symptoms will go away. Do not drive yourself to the hospital. Summary After the procedure, it is common to have a sore throat, hoarseness, nausea, vomiting, or to feel weak, sleepy, or fatigue. For the time period you were told by your health care provider, do not drive or use machinery. Get help right away if you have difficulty breathing, have chest pain, or vomit blood. These symptoms may be an emergency. This information is not intended to replace advice given to you by your health care provider. Make sure you discuss any questions you have with your health care provider. Document Revised: 06/27/2021 Document Reviewed: 06/27/2021 Elsevier Patient Education  2024 Else[employer] Inc.How to Use Chlorhexidine  at Home in the Shower Chlorhexidine  gluconate (CHG) is a germ-killing (antiseptic) wash that's used to clean the skin. It can get rid of the germs that normally live on the skin and can keep them away for about 24 hours. If you're having surgery, you  may be told to shower with CHG at home the night before surgery. This can help lower your risk for infection. To use CHG wash in the shower, follow the steps below. Supplies needed: CHG body wash. Clean washcloth. Clean towel. How to use CHG in the shower Follow these steps unless you're told to use CHG in a different way: Start the shower. Use your normal soap and shampoo to wash your face and hair. Turn off the shower or move out of the shower stream. Pour CHG onto a clean washcloth. Do not use any type of brush or rough sponge. Start at your neck, washing your body  down to your toes. Make sure you: Wash the part of your body where the surgery will be done for at least 1 minute. Do not scrub. Do not use CHG on your head or face unless your health care provider tells you to. If it gets into your ears or eyes, rinse them well with water . Do not wash your genitals with CHG. Wash your back and under your arms. Make sure to wash skin folds. Let the CHG sit on your skin for 1-2 minutes or as long as told. Rinse your entire body in the shower, including all body creases and folds. Turn off the shower. Dry off with a clean towel. Do not put anything on your skin afterward, such as powder, lotion, or perfume. Put on clean clothes or pajamas. If it's the night before surgery, sleep in clean sheets. General tips Use CHG only as told, and follow the instructions on the label. Use the full amount of CHG as told. This is often one bottle. Do not smoke and stay away from flames after using CHG. Your skin may feel sticky after using CHG. This is normal. The sticky feeling will go away as the CHG dries. Do not use CHG: If you have a chlorhexidine  allergy or have reacted to chlorhexidine  in the past. On open wounds or areas of skin that have broken skin, cuts, or scrapes. On babies younger than 3 months of age. Contact a health care provider if: You have questions about using CHG. Your skin gets  irritated or itchy. You have a rash after using CHG. You swallow any CHG. Call your local poison control center [phone] in the [place] Your eyes itch badly, or they become very red or swollen. Your hearing changes. You have trouble seeing. If you can't reach your provider, go to an urgent care or emergency room. Do not drive yourself. Get help right away if: You have swelling or tingling in your mouth or throat. You make high-pitched whistling sounds when you breathe, most often when you breathe out (wheeze). You have trouble breathing. These symptoms may be an emergency. Call 911 right away. Do not wait to see if the symptoms will go away. Do not drive yourself to the hospital. This information is not intended to replace advice given to you by your health care provider. Make sure you discuss any questions you have with your health care provider. Document Revised: 10/13/2022 Document Reviewed: 10/09/2021 Elsevier Patient Education  2024 [employer].

## 2024-02-17 ENCOUNTER — Ambulatory Visit (HOSPITAL_COMMUNITY)
Admission: RE | Admit: 2024-02-17 | Discharge: 2024-02-17 | Disposition: A | Discharge status: Home / Self Care | Attending: Urology | Admitting: Urology

## 2024-02-17 ENCOUNTER — Other Ambulatory Visit: Payer: Self-pay

## 2024-02-17 ENCOUNTER — Ambulatory Visit (HOSPITAL_COMMUNITY): Admitting: Anesthesiology

## 2024-02-17 ENCOUNTER — Encounter (HOSPITAL_COMMUNITY)
Admission: RE | Disposition: A | Discharge status: Home / Self Care | Payer: Self-pay | Source: Home / Self Care | Attending: Urology

## 2024-02-17 ENCOUNTER — Ambulatory Visit (HOSPITAL_COMMUNITY)

## 2024-02-17 ENCOUNTER — Encounter (HOSPITAL_COMMUNITY): Payer: Self-pay | Admitting: Urology

## 2024-02-17 DIAGNOSIS — N1831 Chronic kidney disease, stage 3a: Secondary | ICD-10-CM | POA: Insufficient documentation

## 2024-02-17 DIAGNOSIS — N201 Calculus of ureter: Secondary | ICD-10-CM

## 2024-02-17 DIAGNOSIS — K219 Gastro-esophageal reflux disease without esophagitis: Secondary | ICD-10-CM | POA: Insufficient documentation

## 2024-02-17 DIAGNOSIS — I129 Hypertensive chronic kidney disease with stage 1 through stage 4 chronic kidney disease, or unspecified chronic kidney disease: Secondary | ICD-10-CM | POA: Insufficient documentation

## 2024-02-17 DIAGNOSIS — N132 Hydronephrosis with renal and ureteral calculous obstruction: Secondary | ICD-10-CM | POA: Diagnosis not present

## 2024-02-17 DIAGNOSIS — N200 Calculus of kidney: Secondary | ICD-10-CM

## 2024-02-17 DIAGNOSIS — I120 Hypertensive chronic kidney disease with stage 5 chronic kidney disease or end stage renal disease: Secondary | ICD-10-CM

## 2024-02-17 DIAGNOSIS — E1122 Type 2 diabetes mellitus with diabetic chronic kidney disease: Secondary | ICD-10-CM | POA: Diagnosis not present

## 2024-02-17 HISTORY — PX: HOLMIUM LASER APPLICATION: SHX5852

## 2024-02-17 HISTORY — PX: CYSTOSCOPY/RETROGRADE/URETEROSCOPY/STONE EXTRACTION WITH BASKET: SHX5317

## 2024-02-17 HISTORY — PX: CYSTOSCOPY/URETEROSCOPY/HOLMIUM LASER/STENT PLACEMENT: SHX6546

## 2024-02-17 SURGERY — CYSTOSCOPY/URETEROSCOPY/HOLMIUM LASER/STENT PLACEMENT
Anesthesia: General | Site: Ureter | Laterality: Right

## 2024-02-17 MED ORDER — PROPOFOL 500 MG/50ML IV EMUL
INTRAVENOUS | Status: AC
  Filled 2024-02-17: qty 100

## 2024-02-17 MED ORDER — LACTATED RINGERS IV SOLN: INTRAVENOUS | Status: DC

## 2024-02-17 MED ORDER — OXYCODONE HCL 5 MG/5ML PO SOLN: 5.0000 mg | Freq: Once | ORAL | Status: DC | PRN

## 2024-02-17 MED ORDER — ACETAMINOPHEN 160 MG/5ML PO SOLN
960.0000 mg | Freq: Once | ORAL | Status: AC
  Filled 2024-02-17: qty 30

## 2024-02-17 MED ORDER — SUGAMMADEX SODIUM 200 MG/2ML IV SOLN
INTRAVENOUS | Status: DC | PRN
  Administered 2024-02-17: 200 mg via INTRAVENOUS

## 2024-02-17 MED ORDER — ONDANSETRON HCL 4 MG/2ML IJ SOLN
INTRAMUSCULAR | Status: AC
Start: 2024-02-17 — End: 2024-02-17
  Filled 2024-02-17: qty 2

## 2024-02-17 MED ORDER — OXYCODONE-ACETAMINOPHEN 5-325 MG PO TABS: 1.0000 | ORAL_TABLET | Freq: Three times a day (TID) | ORAL | 0 refills | Status: AC | PRN

## 2024-02-17 MED ORDER — CEFAZOLIN SODIUM-DEXTROSE 2-4 GM/100ML-% IV SOLN
2.0000 g | INTRAVENOUS | Status: AC
  Administered 2024-02-17: 2 g via INTRAVENOUS
  Filled 2024-02-17: qty 100

## 2024-02-17 MED ORDER — OXYCODONE HCL 5 MG PO TABS: 5.0000 mg | ORAL_TABLET | Freq: Once | ORAL | Status: DC | PRN

## 2024-02-17 MED ORDER — DEXAMETHASONE SOD PHOSPHATE PF 10 MG/ML IJ SOLN
INTRAMUSCULAR | Status: DC | PRN
Start: 2024-02-17 — End: 2024-02-17
  Administered 2024-02-17: 8 mg via INTRAVENOUS

## 2024-02-17 MED ORDER — ROCURONIUM BROMIDE 10 MG/ML (PF) SYRINGE
PREFILLED_SYRINGE | INTRAVENOUS | Status: DC | PRN
  Administered 2024-02-17: 40 mg via INTRAVENOUS

## 2024-02-17 MED ORDER — ORAL CARE MOUTH RINSE: 15.0000 mL | Freq: Once | OROMUCOSAL | Status: DC

## 2024-02-17 MED ORDER — WATER FOR IRRIGATION, STERILE IR SOLN
Status: DC | PRN
  Administered 2024-02-17: 1000 mL

## 2024-02-17 MED ORDER — DEXMEDETOMIDINE HCL IN NACL 80 MCG/20ML IV SOLN
INTRAVENOUS | Status: DC | PRN
  Administered 2024-02-17 (×2): 4 ug via INTRAVENOUS

## 2024-02-17 MED ORDER — FENTANYL CITRATE (PF) 100 MCG/2ML IJ SOLN
INTRAMUSCULAR | Status: DC | PRN
  Administered 2024-02-17 (×2): 50 ug via INTRAVENOUS

## 2024-02-17 MED ORDER — ACETAMINOPHEN 500 MG PO TABS
1000.0000 mg | ORAL_TABLET | Freq: Once | ORAL | Status: AC
  Administered 2024-02-17: 1000 mg via ORAL
  Filled 2024-02-17: qty 2

## 2024-02-17 MED ORDER — ONDANSETRON HCL 4 MG/2ML IJ SOLN
INTRAMUSCULAR | Status: DC | PRN
  Administered 2024-02-17: 4 mg via INTRAVENOUS

## 2024-02-17 MED ORDER — SODIUM CHLORIDE 0.9 % IR SOLN
Status: DC | PRN
  Administered 2024-02-17: 3000 mL

## 2024-02-17 MED ORDER — HYDROMORPHONE HCL 1 MG/ML IJ SOLN: 0.2500 mg | INTRAMUSCULAR | Status: DC | PRN

## 2024-02-17 MED ORDER — FENTANYL CITRATE (PF) 100 MCG/2ML IJ SOLN
INTRAMUSCULAR | Status: AC
  Filled 2024-02-17: qty 2

## 2024-02-17 MED ORDER — CHLORHEXIDINE GLUCONATE 0.12 % MT SOLN: 15.0000 mL | Freq: Once | OROMUCOSAL | Status: DC

## 2024-02-17 MED ORDER — LIDOCAINE 2% (20 MG/ML) 5 ML SYRINGE
INTRAMUSCULAR | Status: AC
  Filled 2024-02-17: qty 5

## 2024-02-17 MED ORDER — ALFUZOSIN HCL ER 10 MG PO TB24: 10.0000 mg | ORAL_TABLET | Freq: Every day | ORAL | 0 refills | Status: AC

## 2024-02-17 MED ORDER — LIDOCAINE 2% (20 MG/ML) 5 ML SYRINGE
INTRAMUSCULAR | Status: DC | PRN
Start: 2024-02-17 — End: 2024-02-17
  Administered 2024-02-17: 60 mg via INTRAVENOUS

## 2024-02-17 MED ORDER — DEXMEDETOMIDINE HCL IN NACL 80 MCG/20ML IV SOLN
INTRAVENOUS | Status: AC
  Filled 2024-02-17: qty 80

## 2024-02-17 MED ORDER — ONDANSETRON 4 MG PO TBDP: 4.0000 mg | ORAL_TABLET | Freq: Three times a day (TID) | ORAL | 0 refills | Status: AC | PRN

## 2024-02-17 MED ORDER — ROCURONIUM BROMIDE 10 MG/ML (PF) SYRINGE
PREFILLED_SYRINGE | INTRAVENOUS | Status: AC
  Filled 2024-02-17: qty 10

## 2024-02-17 MED ORDER — DIATRIZOATE MEGLUMINE 30 % UR SOLN
URETHRAL | Status: AC
Start: 2024-02-17 — End: 2024-02-17
  Filled 2024-02-17: qty 100

## 2024-02-17 MED ORDER — PROPOFOL 500 MG/50ML IV EMUL
INTRAVENOUS | Status: DC | PRN
  Administered 2024-02-17: 130 mg via INTRAVENOUS
  Administered 2024-02-17: 150 ug/kg/min via INTRAVENOUS

## 2024-02-17 MED ORDER — DIATRIZOATE MEGLUMINE 30 % UR SOLN
URETHRAL | Status: DC | PRN
  Administered 2024-02-17: 8 mL via URETHRAL

## 2024-02-17 SURGICAL SUPPLY — 23 items
BAG DRAIN URO TABLE W/ADPT NS (BAG) ×1 IMPLANT
BAG HAMPER (MISCELLANEOUS) ×1 IMPLANT
BASKET NITINOL 4 WIRE 16 (BASKET) ×1 IMPLANT
CATH URETL OPEN END 6FR 70 (CATHETERS) ×1 IMPLANT
EXTRACTOR STONE NITINOL NGAGE (UROLOGICAL SUPPLIES) ×1 IMPLANT
GLOVE BIO SURGEON STRL SZ8 (GLOVE) ×1 IMPLANT
GLOVE BIOGEL PI IND STRL 7.0 (GLOVE) ×2 IMPLANT
GOWN STRL REUS W/TWL LRG LVL3 (GOWN DISPOSABLE) ×1 IMPLANT
GOWN STRL REUS W/TWL XL LVL3 (GOWN DISPOSABLE) ×1 IMPLANT
GUIDEWIRE STR DUAL SENSOR (WIRE) ×1 IMPLANT
GUIDEWIRE STR ZIPWIRE 035X150 (MISCELLANEOUS) ×1 IMPLANT
KIT TURNOVER CYSTO (KITS) ×1 IMPLANT
MANIFOLD NEPTUNE II (INSTRUMENTS) ×1 IMPLANT
PACK CYSTO (CUSTOM PROCEDURE TRAY) ×1 IMPLANT
PAD ARMBOARD POSITIONER FOAM (MISCELLANEOUS) ×1 IMPLANT
POSITIONER HEAD 8X9X4 ADT (SOFTGOODS) ×1 IMPLANT
SHEATH NAVIGATOR HD 11/13X36 (SHEATH) ×1 IMPLANT
SOL .9 NS 3000ML IRR UROMATIC (IV SOLUTION) ×2 IMPLANT
STENT URET 6FRX26 CONTOUR (STENTS) IMPLANT
SYR 10ML LL (SYRINGE) ×1 IMPLANT
TOWEL OR 17X26 4PK STRL BLUE (TOWEL DISPOSABLE) ×1 IMPLANT
TRACTIP FLEXIVA PULS ID 200XHI (Laser) IMPLANT
WATER STERILE IRR 500ML POUR (IV SOLUTION) ×1 IMPLANT

## 2024-02-17 NOTE — Transfer of Care (Signed)
 Immediate Anesthesia Transfer of Care Note  Patient: Teresa [name]  Procedure(s) Performed: CYSTOSCOPY/URETEROSCOPY/HOLMIUM LASER/STENT PLACEMENT (Right: Ureter) HOLMIUM LASER APPLICATION (Right: Ureter) CYSTOSCOPY, WITH CALCULUS REMOVAL USING BASKET (Right: Ureter)  Patient Location: PACU  Anesthesia Type:General  Level of Consciousness: drowsy and patient cooperative  Airway & Oxygen Therapy: Patient Spontanous Breathing and Patient connected to nasal cannula oxygen  Post-op Assessment: Report given to RN and Post -op Vital signs reviewed and stable  Post vital signs: Reviewed and stable  Last Vitals:  Vitals Value Taken Time  BP 117/52 02/17/24 15:30  Temp 36.8 C 02/17/24 15:30  Pulse 88 02/17/24 15:32  Resp 17 02/17/24 15:34  SpO2 96 % 02/17/24 15:32  Vitals shown include unfiled device data.  Last Pain:  Vitals:   02/17/24 1530  TempSrc:   PainSc: 0-No pain      Patients Stated Pain Goal: 7 (02/17/24 1309)  Complications: No notable events documented.

## 2024-02-17 NOTE — Op Note (Signed)
 Preoperative diagnosis: Right ureteral stone  Postoperative diagnosis: Same  Procedure: 1 cystoscopy 2 right retrograde pyelography 3.  Intraoperative fluoroscopy, under one hour, with interpretation 4.  Right ureteroscopic stone manipulation with laser lithotripsy 5.  Right 6 x 26 JJ stent placement  Attending: Belvie Standing  Anesthesia: General  Estimated blood loss: None  Drains: Right 6 x 26 JJ ureteral stent without tether  Specimens: stone for analysis  Antibiotics: ancef  Findings: multiple proximal ureteral calculi. Moderate hydronephrosis.  Indications: Patient is a 73 year old female/female with a history of ureteral stone and who has failed medical expulsive therapy after ESWL.  After discussing treatment options, she decided proceed with right ureteroscopic stone manipulation.  Procedure in detail: The patient was brought to the operating room and a brief timeout was done to ensure correct patient, correct procedure, correct site.  General anesthesia was administered patient was placed in dorsal lithotomy position.  Her genitalia was then prepped and draped in usual sterile fashion.  A rigid 22 French cystoscope was passed in the urethra and the bladder.  Bladder was inspected free masses or lesions.  the right ureteral orifices were in the normal orthotopic locations.  a 6 french ureteral catheter was then instilled into the right ureter orifice.  a gentle retrograde was obtained and findings noted above.  we then placed a zip wire through the ureteral catheter and advanced up to the renal pelvis.  we then removed the cystoscope and cannulated the right ureteral orifice with a semirigid ureteroscope.  we then encountered the stones in the proximal ureter.  Using a 242 nm laser fiber and fragmented the stone into smaller pieces.  the pieces were then removed with a Ngage basket.  once all stone fragments were removed we then placed a 6 x 26 double-j ureteral stent over the  original zip wire. We then removed the wire and good coil was noted in the the renal pelvis under fluoroscopy and the bladder under direct vision.     the stone fragments were then removed from the bladder and sent for analysis.   the bladder was then drained and this concluded the procedure which was well tolerated by patient.  Complications: None  Condition: Stable, extubated, transferred to PACU  Plan: Patient is to be discharged home as to follow-up in one week for stent removal.

## 2024-02-17 NOTE — Anesthesia Preprocedure Evaluation (Addendum)
 Anesthesia Evaluation  Patient identified by MRN, date of birth, ID band Patient awake    Reviewed: Allergy & Precautions, H&P , NPO status , Patient's Chart, lab work & pertinent test results, reviewed documented beta blocker date and time   History of Anesthesia Complications (+) PONV and history of anesthetic complications  Airway Mallampati: II  TM Distance: >3 FB Neck ROM: full   Comment: Decreased ROM Dental  (+) Dental Advisory Given, Caps All her upper front teeth are crowns:   Pulmonary shortness of breath   Pulmonary exam normal breath sounds clear to auscultation       Cardiovascular Exercise Tolerance: Good hypertension, Normal cardiovascular exam Rhythm:regular Rate:Normal     Neuro/Psych  Headaches PSYCHIATRIC DISORDERS  Depression     Neuromuscular disease    GI/Hepatic negative GI ROS, Neg liver ROS,GERD  ,,  Endo/Other  diabetes, Type 2    Renal/GU Renal diseaseStage 3a CKD  negative genitourinary   Musculoskeletal  (+) Arthritis , Osteoarthritis,  Fibromyalgia -  Abdominal   Peds  Hematology negative hematology ROS (+)   Anesthesia Other Findings   Reproductive/Obstetrics negative OB ROS                              Anesthesia Physical Anesthesia Plan  ASA: 2  Anesthesia Plan: General   Post-op Pain Management: Dilaudid IV   Induction: Intravenous  PONV Risk Score and Plan: Ondansetron  and Dexamethasone  Airway Management Planned: Oral ETT  Additional Equipment: None  Intra-op Plan:   Post-operative Plan: Extubation in OR  Informed Consent: I have reviewed the patients History and Physical, chart, labs and discussed the procedure including the risks, benefits and alternatives for the proposed anesthesia with the patient or authorized representative who has indicated his/her understanding and acceptance.     Dental Advisory Given  Plan Discussed with:  CRNA and Surgeon  Anesthesia Plan Comments:          Anesthesia Quick Evaluation

## 2024-02-17 NOTE — Anesthesia Procedure Notes (Signed)
 Procedure Name: Intubation Date/Time: 02/17/2024 2:37 PM  Performed by: Para Jerelene CROME, CRNAPre-anesthesia Checklist: Patient identified, Emergency Drugs available, Suction available and Patient being monitored Patient Re-evaluated:Patient Re-evaluated prior to induction Oxygen Delivery Method: Circle system utilized Preoxygenation: Pre-oxygenation with 100% oxygen Induction Type: IV induction Ventilation: Mask ventilation without difficulty Laryngoscope Size: Mac and 4 Tube type: Oral Tube size: 7.0 mm Number of attempts: 1 Airway Equipment and Method: Stylet Placement Confirmation: ETT inserted through vocal cords under direct vision, positive ETCO2, CO2 detector and breath sounds checked- equal and bilateral Secured at: 22 (OETT secured 22 cm at lower lip.) cm Tube secured with: Tape Dental Injury: Teeth and Oropharynx as per pre-operative assessment  Comments: Atraumatic intubation x 1. Lips and teeth remain in preoperative condition.

## 2024-02-17 NOTE — Anesthesia Postprocedure Evaluation (Signed)
 Anesthesia Post Note  Patient: Teresa [name]  Procedure(s) Performed: CYSTOSCOPY/URETEROSCOPY/HOLMIUM LASER/STENT PLACEMENT (Right: Ureter) HOLMIUM LASER APPLICATION (Right: Ureter) CYSTOSCOPY, WITH CALCULUS REMOVAL USING BASKET (Right: Ureter)  Patient location during evaluation: PACU Anesthesia Type: General Level of consciousness: awake and alert Pain management: pain level controlled Vital Signs Assessment: post-procedure vital signs reviewed and stable Respiratory status: spontaneous breathing, nonlabored ventilation, respiratory function stable and patient connected to nasal cannula oxygen Cardiovascular status: blood pressure returned to baseline and stable Postop Assessment: no apparent nausea or vomiting Anesthetic complications: no   There were no known notable events for this encounter.   Last Vitals:  Vitals:   02/17/24 1545 02/17/24 1551  BP: (!) 125/56 (!) 118/59  Pulse: 81 85  Resp: 17 18  Temp: 36.8 C 36.8 C  SpO2: 96% 96%    Last Pain:  Vitals:   02/17/24 1551  TempSrc:   PainSc: 0-No pain                 Cailan Antonucci L Garrett Bowring

## 2024-02-17 NOTE — Interval H&P Note (Signed)
 History and Physical Interval Note:  02/17/2024 1:56 PM  Teresa [name]  has presented today for surgery, with the diagnosis of Right Ureteral Stone.  The various methods of treatment have been discussed with the patient and family. After consideration of risks, benefits and other options for treatment, the patient has consented to  Procedure(s): CYSTOSCOPY/URETEROSCOPY/HOLMIUM LASER/STENT PLACEMENT (Right) HOLMIUM LASER APPLICATION (Right) as a surgical intervention.  The patient's history has been reviewed, patient examined, no change in status, stable for surgery.  I have reviewed the patient's chart and labs.  Questions were answered to the patient's satisfaction.     [name]

## 2024-02-18 ENCOUNTER — Telehealth: Payer: Self-pay

## 2024-02-18 ENCOUNTER — Encounter (HOSPITAL_COMMUNITY): Payer: Self-pay | Admitting: Urology

## 2024-02-18 NOTE — Telephone Encounter (Signed)
 Medication prior authorization request received.  Completed PA request through cover my meds for drug Ondansetron  4 MG. KEY: [id number]  Approved: Pending

## 2024-02-22 DIAGNOSIS — R7303 Prediabetes: Secondary | ICD-10-CM | POA: Diagnosis not present

## 2024-02-22 DIAGNOSIS — E785 Hyperlipidemia, unspecified: Secondary | ICD-10-CM | POA: Diagnosis not present

## 2024-02-22 DIAGNOSIS — M8580 Other specified disorders of bone density and structure, unspecified site: Secondary | ICD-10-CM | POA: Diagnosis not present

## 2024-02-23 ENCOUNTER — Telehealth: Payer: Self-pay

## 2024-02-23 ENCOUNTER — Other Ambulatory Visit

## 2024-02-23 ENCOUNTER — Other Ambulatory Visit: Payer: Self-pay

## 2024-02-23 DIAGNOSIS — N200 Calculus of kidney: Secondary | ICD-10-CM | POA: Diagnosis not present

## 2024-02-23 DIAGNOSIS — N390 Urinary tract infection, site not specified: Secondary | ICD-10-CM

## 2024-02-23 LAB — URINALYSIS, ROUTINE W REFLEX MICROSCOPIC
Glucose, UA: NEGATIVE
Nitrite, UA: NEGATIVE
Specific Gravity, UA: 1.025 (ref 1.005–1.030)
Urobilinogen, Ur: 1 mg/dL (ref 0.2–1.0)
pH, UA: 6 (ref 5.0–7.5)

## 2024-02-23 LAB — MICROSCOPIC EXAMINATION
RBC, Urine: >30 /HPF — AB (ref 0–2)
WBC, UA: >30 /HPF — AB (ref 0–5)

## 2024-02-23 MED ORDER — NITROFURANTOIN MONOHYD MACRO 100 MG PO CAPS: 100.0000 mg | ORAL_CAPSULE | Freq: Two times a day (BID) | ORAL | 0 refills | Status: AC

## 2024-02-23 NOTE — Telephone Encounter (Signed)
 Patient presents today with complaints of  Urgency and frequency.  UA and Culture done today.  Dr. Sherrilee  reviewed results and Macrobid .  Patient aware of MD recommendations and that we will reach out with culture results.      [email or web address], CMA

## 2024-02-23 NOTE — Telephone Encounter (Signed)
 Dysuria  Patient called with c/o dysuria x 2-3 days days.  Pain: burning  Severity:6/10  Associated Signs and Symptoms:  Fever: no Temp.98.5 Chills: no Hematuria: yes Urgency: yes Frequency: yes Hesitancy:no Incontinence: no Nausea: no Vomiting: no  Urologic History:  Any Recent Urologic Surgeries or Procedures:yes Litho along with stent placement Recurrent UTI's:no Cystitis: no  Prostatitis:no Kidney or Bladder Stones: yes Plan: Walk-in Clinic: no Appointment w/Physician: [no Lab visit scheduled for urine drop off: Yes Advice given:  Do you take on daily medications for UTI suppression No

## 2024-02-25 ENCOUNTER — Ambulatory Visit: Payer: Self-pay

## 2024-02-25 LAB — URINE CULTURE

## 2024-02-28 DIAGNOSIS — M7501 Adhesive capsulitis of right shoulder: Secondary | ICD-10-CM | POA: Diagnosis not present

## 2024-02-28 DIAGNOSIS — M545 Low back pain, unspecified: Secondary | ICD-10-CM | POA: Diagnosis not present

## 2024-02-28 DIAGNOSIS — Z0001 Encounter for general adult medical examination with abnormal findings: Secondary | ICD-10-CM | POA: Diagnosis not present

## 2024-02-28 DIAGNOSIS — G8929 Other chronic pain: Secondary | ICD-10-CM | POA: Diagnosis not present

## 2024-02-28 DIAGNOSIS — M48061 Spinal stenosis, lumbar region without neurogenic claudication: Secondary | ICD-10-CM | POA: Diagnosis not present

## 2024-02-28 DIAGNOSIS — E785 Hyperlipidemia, unspecified: Secondary | ICD-10-CM | POA: Diagnosis not present

## 2024-02-28 DIAGNOSIS — I10 Essential (primary) hypertension: Secondary | ICD-10-CM | POA: Diagnosis not present

## 2024-02-28 DIAGNOSIS — I129 Hypertensive chronic kidney disease with stage 1 through stage 4 chronic kidney disease, or unspecified chronic kidney disease: Secondary | ICD-10-CM | POA: Diagnosis not present

## 2024-02-28 DIAGNOSIS — Z23 Encounter for immunization: Secondary | ICD-10-CM | POA: Diagnosis not present

## 2024-02-28 DIAGNOSIS — Z0000 Encounter for general adult medical examination without abnormal findings: Secondary | ICD-10-CM | POA: Diagnosis not present

## 2024-02-28 DIAGNOSIS — F909 Attention-deficit hyperactivity disorder, unspecified type: Secondary | ICD-10-CM | POA: Diagnosis not present

## 2024-02-28 DIAGNOSIS — N1831 Chronic kidney disease, stage 3a: Secondary | ICD-10-CM | POA: Diagnosis not present

## 2024-02-28 LAB — STONE ANALYSIS
Calcium Oxalate Dihydrate: 10 %
Calcium Oxalate Monohydrate: 90 %
Weight Calculi: 37 mg

## 2024-03-01 ENCOUNTER — Ambulatory Visit: Admitting: Urology

## 2024-03-01 ENCOUNTER — Other Ambulatory Visit (HOSPITAL_COMMUNITY): Payer: Self-pay | Admitting: Internal Medicine

## 2024-03-01 VITALS — BP 103/59 | HR 85

## 2024-03-01 DIAGNOSIS — N201 Calculus of ureter: Secondary | ICD-10-CM

## 2024-03-01 DIAGNOSIS — N200 Calculus of kidney: Secondary | ICD-10-CM

## 2024-03-01 LAB — URINALYSIS, ROUTINE W REFLEX MICROSCOPIC
Nitrite, UA: POSITIVE — AB
Specific Gravity, UA: 1.025 (ref 1.005–1.030)
Urobilinogen, Ur: 2 mg/dL — ABNORMAL HIGH (ref 0.2–1.0)
pH, UA: 6 (ref 5.0–7.5)

## 2024-03-01 LAB — MICROSCOPIC EXAMINATION: RBC, Urine: >30 /HPF — AB (ref 0–2)

## 2024-03-01 MED ORDER — CIPROFLOXACIN HCL 500 MG PO TABS
500.0000 mg | ORAL_TABLET | Freq: Once | ORAL | Status: AC
  Administered 2024-03-01: 500 mg via ORAL

## 2024-03-01 NOTE — Progress Notes (Signed)
   03/01/24  CC: followup nephrolithiasis  HPI: Teresa [name] is a 73yo here for followup for stent removal Blood pressure (!) 103/59, pulse 85. NED. A&Ox3.   No respiratory distress   Abd soft, NT, ND Normal external genitalia with patent urethral meatus  Cystoscopy Procedure Note  Patient identification was confirmed, informed consent was obtained, and patient was prepped using Betadine solution.  Lidocaine  jelly was administered per urethral meatus.    Procedure: - Flexible cystoscope introduced, without any difficulty.   - Thorough search of the bladder revealed:    normal urethral meatus    normal urothelium    no stones    no ulcers     no tumors    no urethral polyps    no trabeculation  - Ureteral orifices were normal in position and appearance. -Using a grasper the right ureteral stent was removed intact  Post-Procedure: - Patient tolerated the procedure well  Assessment/ Plan: Followup 3 months with renal US    No follow-ups on file.  [name], MD

## 2024-03-03 ENCOUNTER — Encounter: Payer: Self-pay | Admitting: Internal Medicine

## 2024-03-03 ENCOUNTER — Telehealth: Payer: Self-pay

## 2024-03-03 NOTE — Telephone Encounter (Signed)
 Auth Submission: NO AUTH NEEDED Site of care: Site of care: [facility] Payer: health team advtg ppo Medication & CPT/J Code(s) submitted: Prolia (Denosumab) [id number] Diagnosis Code:  Route of submission (phone, fax, portal): phone Phone # Fax # Auth type: Buy/Bill PB Units/visits requested: 60mg , q12months x 2doses Reference number: [id number] Approval from: 03/03/24 to 04/12/24

## 2024-03-05 ENCOUNTER — Encounter: Payer: Self-pay | Admitting: Urology

## 2024-03-05 NOTE — Patient Instructions (Signed)

## 2024-03-20 ENCOUNTER — Ambulatory Visit

## 2024-03-22 ENCOUNTER — Ambulatory Visit

## 2024-03-22 DIAGNOSIS — M8580 Other specified disorders of bone density and structure, unspecified site: Secondary | ICD-10-CM | POA: Diagnosis not present

## 2024-03-22 MED ORDER — DENOSUMAB 60 MG/ML ~~LOC~~ SOSY
60.0000 mg | PREFILLED_SYRINGE | Freq: Once | SUBCUTANEOUS | Status: AC
  Administered 2024-03-22: 60 mg via SUBCUTANEOUS

## 2024-03-22 NOTE — Progress Notes (Signed)
 Diagnosis: Osteoporosis  Provider:  Shona Salvo MD  Procedure: Injection  Prolia  (Denosumab ), Dose: 60 mg, Site: subcutaneous, Number of injections: 1  Injection Site(s): Right lower quad. abdomne  Post Care: Observation period completed  Discharge: Condition: Good, Destination: Home . AVS Declined  Performed by:  Delon ONEIDA Officer, RN

## 2024-07-26 ENCOUNTER — Ambulatory Visit: Admitting: Urology

## 2024-09-21 ENCOUNTER — Ambulatory Visit
